# Patient Record
Sex: Male | Born: 1970 | Hispanic: Yes | Smoking: Current every day smoker
Health system: Southern US, Community
[De-identification: ages and names within clinical notes are randomized; demographics above are authoritative.]

## PROBLEM LIST (undated history)

## (undated) DIAGNOSIS — M1990 Unspecified osteoarthritis, unspecified site: Secondary | ICD-10-CM

## (undated) DIAGNOSIS — K603 Anal fistula, unspecified: Secondary | ICD-10-CM

## (undated) DIAGNOSIS — F8081 Childhood onset fluency disorder: Secondary | ICD-10-CM

## (undated) DIAGNOSIS — F419 Anxiety disorder, unspecified: Secondary | ICD-10-CM

## (undated) DIAGNOSIS — F17200 Nicotine dependence, unspecified, uncomplicated: Secondary | ICD-10-CM

## (undated) DIAGNOSIS — K219 Gastro-esophageal reflux disease without esophagitis: Secondary | ICD-10-CM

## (undated) DIAGNOSIS — F32A Depression, unspecified: Secondary | ICD-10-CM

## (undated) HISTORY — DX: Depression, unspecified: F32.A

## (undated) HISTORY — PX: KNEE ARTHROSCOPY: SUR90

## (undated) HISTORY — DX: Gastro-esophageal reflux disease without esophagitis: K21.9

---

## 2014-11-25 ENCOUNTER — Inpatient Hospital Stay (HOSPITAL_COMMUNITY)
Admission: EM | Admit: 2014-11-25 | Discharge: 2014-11-28 | DRG: 872 | Disposition: A | Discharge status: Home / Self Care | Payer: Self-pay | Attending: Internal Medicine | Admitting: Internal Medicine

## 2014-11-25 ENCOUNTER — Encounter (HOSPITAL_COMMUNITY): Payer: Self-pay | Admitting: Emergency Medicine

## 2014-11-25 DIAGNOSIS — L0390 Cellulitis, unspecified: Secondary | ICD-10-CM | POA: Diagnosis present

## 2014-11-25 DIAGNOSIS — R911 Solitary pulmonary nodule: Secondary | ICD-10-CM | POA: Diagnosis present

## 2014-11-25 DIAGNOSIS — F17210 Nicotine dependence, cigarettes, uncomplicated: Secondary | ICD-10-CM | POA: Diagnosis present

## 2014-11-25 DIAGNOSIS — A419 Sepsis, unspecified organism: Principal | ICD-10-CM | POA: Diagnosis present

## 2014-11-25 DIAGNOSIS — F1020 Alcohol dependence, uncomplicated: Secondary | ICD-10-CM | POA: Diagnosis present

## 2014-11-25 DIAGNOSIS — Z791 Long term (current) use of non-steroidal anti-inflammatories (NSAID): Secondary | ICD-10-CM

## 2014-11-25 DIAGNOSIS — K611 Rectal abscess: Secondary | ICD-10-CM | POA: Diagnosis present

## 2014-11-25 DIAGNOSIS — R0789 Other chest pain: Secondary | ICD-10-CM

## 2014-11-25 DIAGNOSIS — R7309 Other abnormal glucose: Secondary | ICD-10-CM | POA: Diagnosis present

## 2014-11-25 DIAGNOSIS — Z6841 Body Mass Index (BMI) 40.0 and over, adult: Secondary | ICD-10-CM

## 2014-11-25 DIAGNOSIS — Z01810 Encounter for preprocedural cardiovascular examination: Secondary | ICD-10-CM

## 2014-11-25 DIAGNOSIS — F1010 Alcohol abuse, uncomplicated: Secondary | ICD-10-CM

## 2014-11-25 HISTORY — DX: Unspecified osteoarthritis, unspecified site: M19.90

## 2014-11-25 LAB — TYPE AND SCREEN
ABO/RH(D): A POS
Antibody Screen: NEGATIVE

## 2014-11-25 LAB — COMPREHENSIVE METABOLIC PANEL
ALK PHOS: 72 U/L (ref 38–126)
ALT: 73 U/L — ABNORMAL HIGH (ref 17–63)
ANION GAP: 9 (ref 5–15)
AST: 44 U/L — AB (ref 15–41)
Albumin: 3.6 g/dL (ref 3.5–5.0)
BILIRUBIN TOTAL: 0.6 mg/dL (ref 0.3–1.2)
BUN: 12 mg/dL (ref 6–20)
CHLORIDE: 100 mmol/L — AB (ref 101–111)
CO2: 26 mmol/L (ref 22–32)
CREATININE: 0.93 mg/dL (ref 0.61–1.24)
Calcium: 8.7 mg/dL — ABNORMAL LOW (ref 8.9–10.3)
GFR calc Af Amer: >60 mL/min (ref >60)
GFR calc non Af Amer: >60 mL/min (ref >60)
Glucose, Bld: 128 mg/dL — ABNORMAL HIGH (ref 65–99)
Potassium: 4.2 mmol/L (ref 3.5–5.1)
Sodium: 135 mmol/L (ref 135–145)
Total Protein: 6.7 g/dL (ref 6.5–8.1)

## 2014-11-25 LAB — DIFFERENTIAL
BASOS PCT: 0 % (ref 0–1)
Basophils Absolute: 0 10*3/uL (ref 0.0–0.1)
EOS ABS: 0.1 10*3/uL (ref 0.0–0.7)
EOS PCT: 1 % (ref 0–5)
LYMPHS ABS: 1.6 10*3/uL (ref 0.7–4.0)
Lymphocytes Relative: 15 % (ref 12–46)
MONOS PCT: 5 % (ref 3–12)
Monocytes Absolute: 0.5 10*3/uL (ref 0.1–1.0)
NEUTROS PCT: 79 % — AB (ref 43–77)
Neutro Abs: 8.3 10*3/uL — ABNORMAL HIGH (ref 1.7–7.7)

## 2014-11-25 LAB — URINALYSIS, ROUTINE W REFLEX MICROSCOPIC
Bilirubin Urine: NEGATIVE
Glucose, UA: NEGATIVE mg/dL
Hgb urine dipstick: NEGATIVE
KETONES UR: NEGATIVE mg/dL
LEUKOCYTES UA: NEGATIVE
Nitrite: NEGATIVE
PH: 5 (ref 5.0–8.0)
Protein, ur: NEGATIVE mg/dL
SPECIFIC GRAVITY, URINE: 1.018 (ref 1.005–1.030)
Urobilinogen, UA: 0.2 mg/dL (ref 0.0–1.0)

## 2014-11-25 LAB — CBC
HCT: 39.8 % (ref 39.0–52.0)
Hemoglobin: 13.7 g/dL (ref 13.0–17.0)
MCH: 32.8 pg (ref 26.0–34.0)
MCHC: 34.4 g/dL (ref 30.0–36.0)
MCV: 95.2 fL (ref 78.0–100.0)
PLATELETS: 172 10*3/uL (ref 150–400)
RBC: 4.18 MIL/uL — ABNORMAL LOW (ref 4.22–5.81)
RDW: 12.8 % (ref 11.5–15.5)
WBC: 11.1 10*3/uL — AB (ref 4.0–10.5)

## 2014-11-25 LAB — I-STAT CG4 LACTIC ACID, ED: LACTIC ACID, VENOUS: 2.19 mmol/L — AB (ref 0.5–2.0)

## 2014-11-25 MED ORDER — MORPHINE SULFATE 4 MG/ML IJ SOLN
4.0000 mg | Freq: Once | INTRAMUSCULAR | Status: AC
  Administered 2014-11-25: 4 mg via INTRAVENOUS
  Filled 2014-11-25: qty 1

## 2014-11-25 MED ORDER — ONDANSETRON HCL 4 MG/2ML IJ SOLN
4.0000 mg | Freq: Once | INTRAMUSCULAR | Status: AC
Start: 2014-11-25 — End: 2014-11-25
  Administered 2014-11-25: 4 mg via INTRAVENOUS
  Filled 2014-11-25: qty 2

## 2014-11-25 NOTE — ED Notes (Signed)
Pt. reports left lower buttocks abscess with drainage onset 3 weeks ago .

## 2014-11-25 NOTE — ED Notes (Signed)
Here for abcess on sacral area.  States I have also had bloody stools for about three weeks.  States it also looks like it has puss in the stool with a foul odor.  Abdomen bloated as well.  Also reports being disoriented with cloudy thinking earlier today.

## 2014-11-26 ENCOUNTER — Encounter (HOSPITAL_COMMUNITY): Payer: Self-pay

## 2014-11-26 ENCOUNTER — Observation Stay (HOSPITAL_COMMUNITY): Payer: Self-pay

## 2014-11-26 ENCOUNTER — Inpatient Hospital Stay (HOSPITAL_COMMUNITY): Payer: Self-pay | Admitting: Anesthesiology

## 2014-11-26 ENCOUNTER — Encounter (HOSPITAL_COMMUNITY)
Admission: EM | Disposition: A | Discharge status: Home / Self Care | Payer: Self-pay | Source: Home / Self Care | Attending: Internal Medicine

## 2014-11-26 ENCOUNTER — Emergency Department (HOSPITAL_COMMUNITY): Payer: Self-pay

## 2014-11-26 DIAGNOSIS — K611 Rectal abscess: Secondary | ICD-10-CM | POA: Diagnosis present

## 2014-11-26 DIAGNOSIS — A419 Sepsis, unspecified organism: Principal | ICD-10-CM | POA: Diagnosis present

## 2014-11-26 DIAGNOSIS — R0789 Other chest pain: Secondary | ICD-10-CM

## 2014-11-26 DIAGNOSIS — F1010 Alcohol abuse, uncomplicated: Secondary | ICD-10-CM

## 2014-11-26 HISTORY — PX: IRRIGATION AND DEBRIDEMENT BUTTOCKS: SHX6601

## 2014-11-26 HISTORY — PX: INCISION AND DRAINAGE ABSCESS: SHX5864

## 2014-11-26 HISTORY — DX: Sepsis, unspecified organism: A41.9

## 2014-11-26 LAB — CBC WITH DIFFERENTIAL/PLATELET
Basophils Absolute: 0 10*3/uL (ref 0.0–0.1)
Basophils Relative: 0 % (ref 0–1)
Eosinophils Absolute: 0.1 10*3/uL (ref 0.0–0.7)
Eosinophils Relative: 1 % (ref 0–5)
HCT: 39.5 % (ref 39.0–52.0)
Hemoglobin: 13.4 g/dL (ref 13.0–17.0)
LYMPHS ABS: 1.8 10*3/uL (ref 0.7–4.0)
LYMPHS PCT: 19 % (ref 12–46)
MCH: 31.9 pg (ref 26.0–34.0)
MCHC: 33.9 g/dL (ref 30.0–36.0)
MCV: 94 fL (ref 78.0–100.0)
MONO ABS: 0.6 10*3/uL (ref 0.1–1.0)
Monocytes Relative: 6 % (ref 3–12)
Neutro Abs: 7 10*3/uL (ref 1.7–7.7)
Neutrophils Relative %: 74 % (ref 43–77)
PLATELETS: 160 10*3/uL (ref 150–400)
RBC: 4.2 MIL/uL — AB (ref 4.22–5.81)
RDW: 12.7 % (ref 11.5–15.5)
WBC: 9.5 10*3/uL (ref 4.0–10.5)

## 2014-11-26 LAB — I-STAT TROPONIN, ED: Troponin i, poc: 0 ng/mL (ref 0.00–0.08)

## 2014-11-26 LAB — SURGICAL PCR SCREEN
MRSA, PCR: NEGATIVE
Staphylococcus aureus: NEGATIVE

## 2014-11-26 LAB — PROCALCITONIN: Procalcitonin: <0.1 ng/mL

## 2014-11-26 LAB — PROTIME-INR
INR: 1.03 (ref 0.00–1.49)
Prothrombin Time: 13.7 seconds (ref 11.6–15.2)

## 2014-11-26 LAB — COMPREHENSIVE METABOLIC PANEL
ALBUMIN: 3.4 g/dL — AB (ref 3.5–5.0)
ALT: 66 U/L — ABNORMAL HIGH (ref 17–63)
AST: 34 U/L (ref 15–41)
Alkaline Phosphatase: 69 U/L (ref 38–126)
Anion gap: 8 (ref 5–15)
BUN: 10 mg/dL (ref 6–20)
CALCIUM: 8.3 mg/dL — AB (ref 8.9–10.3)
CHLORIDE: 100 mmol/L — AB (ref 101–111)
CO2: 26 mmol/L (ref 22–32)
CREATININE: 0.91 mg/dL (ref 0.61–1.24)
GFR calc Af Amer: >60 mL/min (ref >60)
GFR calc non Af Amer: >60 mL/min (ref >60)
Glucose, Bld: 91 mg/dL (ref 65–99)
Potassium: 4 mmol/L (ref 3.5–5.1)
Sodium: 134 mmol/L — ABNORMAL LOW (ref 135–145)
Total Bilirubin: 0.7 mg/dL (ref 0.3–1.2)
Total Protein: 6.6 g/dL (ref 6.5–8.1)

## 2014-11-26 LAB — TROPONIN I
Troponin I: <0.03 ng/mL (ref <0.031)
Troponin I: <0.03 ng/mL (ref <0.031)
Troponin I: <0.03 ng/mL (ref <0.031)

## 2014-11-26 LAB — APTT: aPTT: 30 seconds (ref 24–37)

## 2014-11-26 LAB — GLUCOSE, CAPILLARY: Glucose-Capillary: 105 mg/dL — ABNORMAL HIGH (ref 65–99)

## 2014-11-26 LAB — ABO/RH: ABO/RH(D): A POS

## 2014-11-26 LAB — LACTIC ACID, PLASMA: Lactic Acid, Venous: 1.4 mmol/L (ref 0.5–2.0)

## 2014-11-26 SURGERY — IRRIGATION AND DEBRIDEMENT BUTTOCKS
Anesthesia: General | Site: Buttocks | Laterality: Left

## 2014-11-26 MED ORDER — SUCCINYLCHOLINE CHLORIDE 20 MG/ML IJ SOLN
INTRAMUSCULAR | Status: DC | PRN
  Administered 2014-11-26: 140 mg via INTRAVENOUS

## 2014-11-26 MED ORDER — HYDROMORPHONE HCL 1 MG/ML IJ SOLN
1.0000 mg | INTRAMUSCULAR | Status: DC | PRN
  Administered 2014-11-26 – 2014-11-27 (×6): 1 mg via INTRAVENOUS
  Filled 2014-11-26 (×7): qty 1

## 2014-11-26 MED ORDER — OXYCODONE-ACETAMINOPHEN 5-325 MG PO TABS
1.0000 | ORAL_TABLET | ORAL | Status: DC | PRN
  Administered 2014-11-26 – 2014-11-27 (×3): 1 via ORAL
  Filled 2014-11-26 (×3): qty 1

## 2014-11-26 MED ORDER — IOHEXOL 300 MG/ML  SOLN
100.0000 mL | Freq: Once | INTRAMUSCULAR | Status: AC | PRN
  Administered 2014-11-26: 100 mL via INTRAVENOUS

## 2014-11-26 MED ORDER — PROPOFOL 10 MG/ML IV BOLUS
INTRAVENOUS | Status: DC | PRN
  Administered 2014-11-26: 200 mg via INTRAVENOUS

## 2014-11-26 MED ORDER — LORAZEPAM 2 MG/ML IJ SOLN: 1.0000 mg | Freq: Four times a day (QID) | INTRAMUSCULAR | Status: DC | PRN

## 2014-11-26 MED ORDER — VANCOMYCIN HCL IN DEXTROSE 1-5 GM/200ML-% IV SOLN
1000.0000 mg | Freq: Once | INTRAVENOUS | Status: AC
  Administered 2014-11-26: 1000 mg via INTRAVENOUS
  Filled 2014-11-26: qty 200

## 2014-11-26 MED ORDER — SODIUM CHLORIDE 0.9 % IV SOLN
1000.0000 mL | Freq: Once | INTRAVENOUS | Status: AC
  Administered 2014-11-26: 1000 mL via INTRAVENOUS

## 2014-11-26 MED ORDER — LORAZEPAM 1 MG PO TABS: 1.0000 mg | ORAL_TABLET | Freq: Four times a day (QID) | ORAL | Status: DC | PRN

## 2014-11-26 MED ORDER — PIPERACILLIN-TAZOBACTAM 3.375 G IVPB
3.3750 g | Freq: Three times a day (TID) | INTRAVENOUS | Status: DC
  Administered 2014-11-26 – 2014-11-28 (×7): 3.375 g via INTRAVENOUS
  Filled 2014-11-26 (×10): qty 50

## 2014-11-26 MED ORDER — GLYCOPYRROLATE 0.2 MG/ML IJ SOLN
INTRAMUSCULAR | Status: AC
  Filled 2014-11-26: qty 3

## 2014-11-26 MED ORDER — FENTANYL CITRATE (PF) 100 MCG/2ML IJ SOLN
INTRAMUSCULAR | Status: DC | PRN
  Administered 2014-11-26: 100 ug via INTRAVENOUS
  Administered 2014-11-26: 50 ug via INTRAVENOUS
  Administered 2014-11-26: 100 ug via INTRAVENOUS

## 2014-11-26 MED ORDER — ONDANSETRON HCL 4 MG/2ML IJ SOLN
INTRAMUSCULAR | Status: DC | PRN
  Administered 2014-11-26: 4 mg via INTRAVENOUS

## 2014-11-26 MED ORDER — 0.9 % SODIUM CHLORIDE (POUR BTL) OPTIME
TOPICAL | Status: DC | PRN
  Administered 2014-11-26: 1000 mL

## 2014-11-26 MED ORDER — LACTATED RINGERS IV SOLN
INTRAVENOUS | Status: DC | PRN
  Administered 2014-11-26: 09:00:00 via INTRAVENOUS

## 2014-11-26 MED ORDER — ACETAMINOPHEN 650 MG RE SUPP
650.0000 mg | Freq: Four times a day (QID) | RECTAL | Status: DC | PRN
Start: 2014-11-26 — End: 2014-11-28

## 2014-11-26 MED ORDER — FENTANYL CITRATE (PF) 250 MCG/5ML IJ SOLN
INTRAMUSCULAR | Status: AC
  Filled 2014-11-26: qty 5

## 2014-11-26 MED ORDER — PNEUMOCOCCAL VAC POLYVALENT 25 MCG/0.5ML IJ INJ
0.5000 mL | INJECTION | INTRAMUSCULAR | Status: AC
  Administered 2014-11-27: 0.5 mL via INTRAMUSCULAR
  Filled 2014-11-26: qty 0.5

## 2014-11-26 MED ORDER — LACTATED RINGERS IV SOLN
INTRAVENOUS | Status: DC
  Administered 2014-11-26: 09:00:00 via INTRAVENOUS

## 2014-11-26 MED ORDER — MIDAZOLAM HCL 2 MG/2ML IJ SOLN
INTRAMUSCULAR | Status: AC
  Filled 2014-11-26: qty 2

## 2014-11-26 MED ORDER — ONDANSETRON HCL 4 MG PO TABS: 4.0000 mg | ORAL_TABLET | Freq: Four times a day (QID) | ORAL | Status: DC | PRN

## 2014-11-26 MED ORDER — ONDANSETRON HCL 4 MG/2ML IJ SOLN: 4.0000 mg | Freq: Four times a day (QID) | INTRAMUSCULAR | Status: DC | PRN

## 2014-11-26 MED ORDER — PIPERACILLIN-TAZOBACTAM 3.375 G IVPB
3.3750 g | Freq: Once | INTRAVENOUS | Status: AC
  Administered 2014-11-26: 3.375 g via INTRAVENOUS
  Filled 2014-11-26: qty 50

## 2014-11-26 MED ORDER — SODIUM CHLORIDE 0.9 % IV SOLN
1000.0000 mL | INTRAVENOUS | Status: DC
  Administered 2014-11-27: 1000 mL via INTRAVENOUS

## 2014-11-26 MED ORDER — LORAZEPAM 2 MG/ML IJ SOLN: 0.0000 mg | Freq: Four times a day (QID) | INTRAMUSCULAR | Status: AC

## 2014-11-26 MED ORDER — SODIUM CHLORIDE 0.9 % IV SOLN: 1000.0000 mL | INTRAVENOUS | Status: DC

## 2014-11-26 MED ORDER — LIDOCAINE HCL 4 % MT SOLN
OROMUCOSAL | Status: DC | PRN
  Administered 2014-11-26: 4 mL via TOPICAL

## 2014-11-26 MED ORDER — SODIUM CHLORIDE 0.9 % IV SOLN
INTRAVENOUS | Status: DC
  Administered 2014-11-26: 03:00:00 via INTRAVENOUS

## 2014-11-26 MED ORDER — FOLIC ACID 1 MG PO TABS
1.0000 mg | ORAL_TABLET | Freq: Every day | ORAL | Status: DC
  Administered 2014-11-27 – 2014-11-28 (×2): 1 mg via ORAL
  Filled 2014-11-26 (×4): qty 1

## 2014-11-26 MED ORDER — SODIUM CHLORIDE 0.9 % IV SOLN
INTRAVENOUS | Status: DC
  Administered 2014-11-26 (×2): via INTRAVENOUS

## 2014-11-26 MED ORDER — PROPOFOL 10 MG/ML IV BOLUS: INTRAVENOUS | Status: DC | PRN

## 2014-11-26 MED ORDER — LORAZEPAM 2 MG/ML IJ SOLN: 0.0000 mg | Freq: Two times a day (BID) | INTRAMUSCULAR | Status: DC

## 2014-11-26 MED ORDER — HYDROMORPHONE HCL 1 MG/ML IJ SOLN: 0.2500 mg | INTRAMUSCULAR | Status: DC | PRN

## 2014-11-26 MED ORDER — LIDOCAINE HCL (CARDIAC) 20 MG/ML IV SOLN
INTRAVENOUS | Status: DC | PRN
  Administered 2014-11-26: 50 mg via INTRAVENOUS

## 2014-11-26 MED ORDER — ADULT MULTIVITAMIN W/MINERALS CH
1.0000 | ORAL_TABLET | Freq: Every day | ORAL | Status: DC
  Administered 2014-11-27 – 2014-11-28 (×2): 1 via ORAL
  Filled 2014-11-26 (×4): qty 1

## 2014-11-26 MED ORDER — MIDAZOLAM HCL 5 MG/5ML IJ SOLN
INTRAMUSCULAR | Status: DC | PRN
  Administered 2014-11-26: 2 mg via INTRAVENOUS

## 2014-11-26 MED ORDER — HYDROMORPHONE HCL 1 MG/ML IJ SOLN
1.0000 mg | INTRAMUSCULAR | Status: DC | PRN
  Administered 2014-11-26: 1 mg via INTRAVENOUS
  Filled 2014-11-26: qty 1

## 2014-11-26 MED ORDER — PIPERACILLIN-TAZOBACTAM 3.375 G IVPB 30 MIN: 3.3750 g | Freq: Once | INTRAVENOUS | Status: DC

## 2014-11-26 MED ORDER — NEOSTIGMINE METHYLSULFATE 10 MG/10ML IV SOLN
INTRAVENOUS | Status: AC
  Filled 2014-11-26: qty 1

## 2014-11-26 MED ORDER — VANCOMYCIN HCL 10 G IV SOLR
1250.0000 mg | Freq: Three times a day (TID) | INTRAVENOUS | Status: DC
  Administered 2014-11-26 – 2014-11-28 (×7): 1250 mg via INTRAVENOUS
  Filled 2014-11-26 (×10): qty 1250

## 2014-11-26 MED ORDER — ACETAMINOPHEN 325 MG PO TABS: 650.0000 mg | ORAL_TABLET | Freq: Four times a day (QID) | ORAL | Status: DC | PRN

## 2014-11-26 MED ORDER — VITAMIN B-1 100 MG PO TABS
100.0000 mg | ORAL_TABLET | Freq: Every day | ORAL | Status: DC
  Administered 2014-11-27 – 2014-11-28 (×2): 100 mg via ORAL
  Filled 2014-11-26 (×4): qty 1

## 2014-11-26 MED ORDER — THIAMINE HCL 100 MG/ML IJ SOLN
100.0000 mg | Freq: Every day | INTRAMUSCULAR | Status: DC
  Filled 2014-11-26 (×3): qty 1

## 2014-11-26 SURGICAL SUPPLY — 52 items
BENZOIN TINCTURE PRP APPL 2/3 (GAUZE/BANDAGES/DRESSINGS) IMPLANT
BLADE SURG 15 STRL LF DISP TIS (BLADE) ×1 IMPLANT
BLADE SURG 15 STRL SS (BLADE) ×2
BLADE SURG ROTATE 9660 (MISCELLANEOUS) IMPLANT
CANISTER SUCTION 2500CC (MISCELLANEOUS) ×3 IMPLANT
CLOSURE WOUND 1/2 X4 (GAUZE/BANDAGES/DRESSINGS)
COVER SURGICAL LIGHT HANDLE (MISCELLANEOUS) ×3 IMPLANT
DRAIN PENROSE 1/2X12 LTX STRL (WOUND CARE) ×3 IMPLANT
DRAPE LAPAROSCOPIC ABDOMINAL (DRAPES) ×3 IMPLANT
DRAPE LAPAROTOMY T 102X78X121 (DRAPES) IMPLANT
DRAPE UTILITY XL STRL (DRAPES) IMPLANT
ELECT CAUTERY BLADE 6.4 (BLADE) ×3 IMPLANT
ELECT REM PT RETURN 9FT ADLT (ELECTROSURGICAL) ×3
ELECTRODE REM PT RTRN 9FT ADLT (ELECTROSURGICAL) ×1 IMPLANT
GAUZE SPONGE 4X4 12PLY STRL (GAUZE/BANDAGES/DRESSINGS) ×3 IMPLANT
GLOVE BIO SURGEON STRL SZ7 (GLOVE) ×3 IMPLANT
GLOVE BIOGEL PI IND STRL 6.5 (GLOVE) ×1 IMPLANT
GLOVE BIOGEL PI IND STRL 7.0 (GLOVE) ×1 IMPLANT
GLOVE BIOGEL PI IND STRL 7.5 (GLOVE) ×2 IMPLANT
GLOVE BIOGEL PI IND STRL 8 (GLOVE) ×1 IMPLANT
GLOVE BIOGEL PI INDICATOR 6.5 (GLOVE) ×2
GLOVE BIOGEL PI INDICATOR 7.0 (GLOVE) ×2
GLOVE BIOGEL PI INDICATOR 7.5 (GLOVE) ×4
GLOVE BIOGEL PI INDICATOR 8 (GLOVE) ×2
GLOVE SURG SS PI 7.0 STRL IVOR (GLOVE) ×3 IMPLANT
GOWN STRL REUS W/ TWL LRG LVL3 (GOWN DISPOSABLE) ×3 IMPLANT
GOWN STRL REUS W/TWL LRG LVL3 (GOWN DISPOSABLE) ×6
KIT BASIN OR (CUSTOM PROCEDURE TRAY) ×3 IMPLANT
KIT ROOM TURNOVER OR (KITS) ×3 IMPLANT
NEEDLE HYPO 25GX1X1/2 BEV (NEEDLE) IMPLANT
NS IRRIG 1000ML POUR BTL (IV SOLUTION) ×3 IMPLANT
PACK SURGICAL SETUP 50X90 (CUSTOM PROCEDURE TRAY) ×3 IMPLANT
PAD ARMBOARD 7.5X6 YLW CONV (MISCELLANEOUS) ×9 IMPLANT
PENCIL BUTTON HOLSTER BLD 10FT (ELECTRODE) ×3 IMPLANT
SPECIMEN JAR SMALL (MISCELLANEOUS) IMPLANT
SPONGE LAP 18X18 X RAY DECT (DISPOSABLE) IMPLANT
SPONGE LAP 4X18 X RAY DECT (DISPOSABLE) IMPLANT
STRIP CLOSURE SKIN 1/2X4 (GAUZE/BANDAGES/DRESSINGS) IMPLANT
SURGILUBE 2OZ TUBE FLIPTOP (MISCELLANEOUS) ×3 IMPLANT
SUT ETHILON 2 0 FS 18 (SUTURE) ×3 IMPLANT
SUT PROLENE 3 0 PS 1 (SUTURE) IMPLANT
SUT VIC AB 3-0 SH 18 (SUTURE) IMPLANT
SWAB COLLECTION DEVICE MRSA (MISCELLANEOUS) ×3 IMPLANT
SYR BULB IRRIGATION 50ML (SYRINGE) ×3 IMPLANT
SYR CONTROL 10ML LL (SYRINGE) IMPLANT
TOWEL OR 17X24 6PK STRL BLUE (TOWEL DISPOSABLE) ×6 IMPLANT
TOWEL OR 17X26 10 PK STRL BLUE (TOWEL DISPOSABLE) IMPLANT
TUBE ANAEROBIC SPECIMEN COL (MISCELLANEOUS) ×3 IMPLANT
TUBE CONNECTING 12'X1/4 (SUCTIONS) ×1
TUBE CONNECTING 12X1/4 (SUCTIONS) ×2 IMPLANT
WATER STERILE IRR 1000ML POUR (IV SOLUTION) IMPLANT
YANKAUER SUCT BULB TIP NO VENT (SUCTIONS) ×3 IMPLANT

## 2014-11-26 NOTE — Anesthesia Procedure Notes (Signed)
Procedure Name: Intubation Date/Time: 11/26/2014 9:20 AM Performed by: Tamala Fothergill S Intubation Type: IV induction Laryngoscope Size: Miller and 3 Grade View: Grade II Tube type: Oral Tube size: 7.5 mm Number of attempts: 1 Placement Confirmation: ETT inserted through vocal cords under direct vision,  breath sounds checked- equal and bilateral and positive ETCO2 Secured at: 23 cm Tube secured with: Tape Dental Injury: Teeth and Oropharynx as per pre-operative assessment

## 2014-11-26 NOTE — H&P (Signed)
[facility] History and Physical  Jon Wade VEL:[id number] DOB: 1970/10/23 DOA: 11/25/2014  Referring physician: Dr.Yelverton. PCP: No PCP Per Patient  Specialists: None.  Chief Complaint: Rectal bleeding and pain.  HPI: Jon Wade is a 44 y.o. male with history of alcohol abuse presents to ER because of rectal pain and bleeding. Patient states that over the last couple of months patient has been having rectal pain but over the last 3 days pain worsened with fever chills and worsening rectal bleeding. In the ER patient had CT abdomen and pelvis which shows features consistent with perirectal abscess and on exam patient has induration around the rectal area. On-call surgeon Dr. Redmond Pulling has been consulted and patient has been admitted for further management. Patient also stated some crampy type of chest pain mostly in the epigastric area over the last few days. EKG and chest x-ray are unremarkable and [name] patient is chest pain-free. Patient also had 2-3 episodes of nausea vomiting and abdomen appears benign at this time.   Review of Systems: As presented in the history of presenting illness, rest negative.  History reviewed. No pertinent past medical history. Past Surgical History  Procedure Laterality Date  . Knee arthroscopy     Social History:  reports that he has been smoking.  He does not have any smokeless tobacco history on file. He reports that he drinks alcohol. He reports that he uses illicit drugs (Marijuana). Where does patient live home. Can patient participate in ADLs? Yes.  No Known Allergies  Family History:  Family History  Problem Relation Age of Onset  . Diabetes Mellitus II Father       Prior to Admission medications   Medication Sig Start Date End Date Taking? Authorizing Provider  ibuprofen (ADVIL,MOTRIN) 200 MG tablet Take 400-800 mg by mouth every 6 (six) hours as needed for mild pain.   Yes Historical Provider, MD    Physical Exam: Filed  Vitals:   11/26/14 0130 11/26/14 0157 11/26/14 0221 11/26/14 0230  BP: 131/80  142/86   Pulse: 83  81 80  Temp:  99.9 F (37.7 C) 99.6 F (37.6 C)   TempSrc:  Oral Oral   Resp: 20  18   Height:   5' 10.8" (1.798 m)   Weight:   130.228 kg (287 lb 1.6 oz)   SpO2: 94%  100%      General:  Moderately built and nourished.  Eyes: Anicteric no pallor.  ENT: No discharge from the ears eyes nose and mouth.  Neck: No mass felt.  Cardiovascular: S1 and S2 heard.  Respiratory: No rhonchi or crepitations.  Abdomen: Soft nontender bowel sounds present.  Skin: Induration around the rectal area.  Musculoskeletal: No edema.  Psychiatric: Appears normal.  Neurologic: Alert and oriented to time place and person. Moves all extremities.  Labs on Admission:  Basic Metabolic Panel:  Recent Labs Lab 11/25/14 2155  NA 135  K 4.2  CL 100*  CO2 26  GLUCOSE 128*  BUN 12  CREATININE 0.93  CALCIUM 8.7*   Liver Function Tests:  Recent Labs Lab 11/25/14 2155  AST 44*  ALT 73*  ALKPHOS 72  BILITOT 0.6  PROT 6.7  ALBUMIN 3.6   No results for input(s): LIPASE, AMYLASE in the last 168 hours. No results for input(s): AMMONIA in the last 168 hours. CBC:  Recent Labs Lab 11/25/14 2155  WBC 11.1*  NEUTROABS 8.3*  HGB 13.7  HCT 39.8  MCV 95.2  PLT 172   Cardiac  Enzymes: No results for input(s): CKTOTAL, CKMB, CKMBINDEX, TROPONINI in the last 168 hours.  BNP (last 3 results) No results for input(s): BNP in the last 8760 hours.  ProBNP (last 3 results) No results for input(s): PROBNP in the last 8760 hours.  CBG: No results for input(s): GLUCAP in the last 168 hours.  Radiological Exams on Admission: Dg Chest 2 View  11/26/2014   CLINICAL DATA:  Mid chest pain tonight, blood in stool.  EXAM: CHEST  2 VIEW  COMPARISON:  Included lung bases from CT abdomen earlier this day.  FINDINGS: The cardiomediastinal contours are normal. The lungs are clear. Small pulmonary nodule  on prior CT is not seen radiographically. Pulmonary vasculature is normal. No consolidation, pleural effusion, or pneumothorax. No acute osseous abnormalities are seen. Mild degenerative change in the spine.  IMPRESSION: No acute pulmonary process.   Electronically Signed   By: Jeb Levering M.D.   On: 11/26/2014 02:56   Ct Abdomen Pelvis W Contrast  11/26/2014   CLINICAL DATA:  Initial evaluation for acute rectal bleeding, concern for perirectal abscess.  EXAM: CT ABDOMEN AND PELVIS WITH CONTRAST  TECHNIQUE: Multidetector CT imaging of the abdomen and pelvis was performed using the standard protocol following bolus administration of intravenous contrast.  CONTRAST:  160mL OMNIPAQUE IOHEXOL 300 MG/ML  SOLN  COMPARISON:  None.  FINDINGS: AE 5 mm nodule present within the left lower lobe (series 205, image 5). The visualized lung bases are otherwise clear.  Diffuse hypoattenuation of the liver is consistent with steatosis. Focal fatty sparing seen within the subcapsular left hepatic lobe. Liver otherwise unremarkable. Gallbladder within normal limits. No biliary dilatation. The spleen, adrenal glands, and pancreas demonstrate a normal contrast enhanced appearance.  Kidneys are equal in size with symmetric enhancement. No nephrolithiasis, hydronephrosis, or focal enhancing renal mass.  The stomach is within normal limits. No evidence for bowel obstruction. Appendix is normal. No acute inflammatory changes seen about the bowels.  There is a in loculated hypodense collection measuring 8.6 x 2.4 x 4.2 cm within the left paramedian subcu gluteal fat (series 201, image 103). This closely approximates the left lateral aspect of the rectum near the anal verge. There is overlying inflammatory stranding with skin thickening, suggestive of cellulitis. No dissecting soft tissue emphysema identified.  Bladder within normal limits.  Prostate normal.  No free air or fluid. Small fat containing bilateral inguinal hernias noted.  No pathologically enlarged intra-abdominal or pelvic lymph nodes identified. Normal intravascular enhancement seen throughout the intra-abdominal aorta. No aneurysm.  No acute osseous abnormality. No worrisome lytic or blastic osseous lesion.  IMPRESSION: 1. 8.6 x 2.4 x 4.2 cm abscess within the left paramedian subcutaneous gluteal fat, closely approximating the rectum near the level of the anal verge. Overlying inflammatory changes suggestive of associated cellulitis. 2. No other acute intra-abdominal or pelvic process identified. 3. Hepatic steatosis. 4. 5 mm left lower lobe pulmonary nodule, indeterminate. If the patient is at high risk for bronchogenic carcinoma, follow-up chest CT at 6-12 months is recommended. If the patient is at low risk for bronchogenic carcinoma, follow-up chest CT at 12 months is recommended. This recommendation follows the consensus statement: Guidelines for Management of Small Pulmonary Nodules Detected on CT Scans: A Statement from the [employer] as published in Radiology 2005;237:395-400.   Electronically Signed   By: Jeannine Boga M.D.   On: 11/26/2014 01:16    EKG: Independently reviewed. Normal sinus rhythm.  Assessment/Plan Principal Problem:   Sepsis Active Problems:  Perirectal abscess   Alcohol abuse   Atypical chest pain   1. Sepsis from perirectal abscess - appreciate surgery consult. I have reviewed Dr. Dois Davenport notes and recommendations. Patient will be kept nothing by mouth and on IV antibiotics. Patient likely to have procedure in morning for the rectal abscess. 2. History of alcohol abuse - patient is placed on alcohol withdrawal protocol. 3. Atypical chest pain - patient has pain mostly cramping in nature. Cycle cardiac markers. Currently chest pain-free. 4. Mildly elevated LFTs most likely secondary to alcoholism - follow LFTs. Further workup as outpatient.   DVT Prophylaxis SCDs.  Code Status: Full code.  Family Communication:  Discussed with patient.  Disposition Plan: Admit to inpatient.    Yariela Tison N. [facility] Pager [phone].  If 7PM-7AM, please contact night-coverage [email or web address] Password [facility] 11/26/2014, 3:27 AM

## 2014-11-26 NOTE — ED Provider Notes (Signed)
CSN: [id number]     Arrival date & time 11/25/14  1859 History   First MD Initiated Contact with Patient 11/25/14 2107     Chief Complaint  Patient presents with  . Abscess     (Consider location/radiation/quality/duration/timing/severity/associated sxs/prior Treatment) Patient is a 44 y.o. male presenting with abscess. The history is provided by the patient. No language interpreter was used.  Abscess Location:  Ano-genital Ano-genital abscess location:  L buttock Associated symptoms: fever, nausea and vomiting   Associated symptoms comment:  The patient complains of a painful swelling to left buttock near the rectum, bleeding and "white discharge" per rectum, and fever. He reports on and off rectal bleeding for a long time (years) that has been more constant for 3 weeks. Symptoms of abscess started 3 weeks ago as well with nausea and vomiting. He reports developing a fever today. In addition to blood per rectum (normal stool color) he is passing what he describes as white mucous that is malodorous. No abdominal pain.   History reviewed. No pertinent past medical history. Past Surgical History  Procedure Laterality Date  . Knee arthroscopy     No family history on file. History  Substance Use Topics  . Smoking status: Current Every Day Smoker  . Smokeless tobacco: Not on file  . Alcohol Use: Yes    Review of Systems  Constitutional: Positive for fever and chills.  HENT: Negative.   Respiratory: Negative.  Negative for shortness of breath.   Cardiovascular: Negative.  Negative for chest pain.  Gastrointestinal: Positive for nausea, vomiting and blood in stool. Negative for abdominal pain.  Genitourinary: Negative.   Musculoskeletal: Negative.   Skin: Negative.        See HPI.  Neurological: Positive for weakness and light-headedness.      Allergies  Review of patient's allergies indicates no known allergies.  Home Medications   Prior to Admission medications    Medication Sig Start Date End Date Taking? Authorizing Provider  ibuprofen (ADVIL,MOTRIN) 200 MG tablet Take 400-800 mg by mouth every 6 (six) hours as needed for mild pain.   Yes Historical Provider, MD   BP 141/90 mmHg  Pulse 94  Temp(Src) 100.1 F (37.8 C) (Oral)  Resp 18  Ht 5\' 9"  (1.753 m)  Wt 290 lb (131.543 kg)  BMI 42.81 kg/m2  SpO2 100% Physical Exam  Constitutional: He is oriented to person, place, and time. He appears well-developed and well-nourished.  Neck: Normal range of motion.  Cardiovascular: Normal rate.   Pulmonary/Chest: Effort normal.  Abdominal: Soft. There is no tenderness. There is no rebound and no guarding.  Genitourinary:  Large indurated tender area left buttock at gluteal fold with tenderness extending to perirectal area. No area of fluctuance. There is no active bleeding from rectum.   Musculoskeletal: Normal range of motion.  Neurological: He is alert and oriented to person, place, and time. Coordination normal.  Skin: Skin is warm and dry. There is erythema.  Psychiatric: He has a normal mood and affect.    ED Course  Procedures (including critical care time) Labs Review Labs Reviewed  CBC - Abnormal; Notable for the following:    WBC 11.1 (*)    RBC 4.18 (*)    All other components within normal limits  COMPREHENSIVE METABOLIC PANEL - Abnormal; Notable for the following:    Chloride 100 (*)    Glucose, Bld 128 (*)    Calcium 8.7 (*)    AST 44 (*)    ALT  73 (*)    All other components within normal limits  DIFFERENTIAL - Abnormal; Notable for the following:    Neutrophils Relative % 79 (*)    Neutro Abs 8.3 (*)    All other components within normal limits  I-STAT CG4 LACTIC ACID, ED - Abnormal; Notable for the following:    Lactic Acid, Venous 2.19 (*)    All other components within normal limits  URINALYSIS, ROUTINE W REFLEX MICROSCOPIC (NOT AT [facility])  POC OCCULT BLOOD, ED  TYPE AND SCREEN  ABO/RH    Imaging Review No results  found.   EKG Interpretation None      MDM   Final diagnoses:  None    1. Perirectal abscess  CT pending  Discussed with Dr. Greer Pickerel who advised he will see and evaluate the patient. Zosyn started. The patient is being kept NPO until surgical evaluation.     Charlann Lange, PA-C 11/26/14 0018  Charlesetta Shanks, MD 11/26/14 [phone]

## 2014-11-26 NOTE — Transfer of Care (Signed)
Immediate Anesthesia Transfer of Care Note  Patient: Jon Wade  Procedure(s) Performed: Procedure(s): Incision and Drainage of Left Buttock Abscess (Left)  Patient Location: PACU  Anesthesia Type:General  Level of Consciousness: awake, alert  and oriented  Airway & Oxygen Therapy: Patient Spontanous Breathing and Patient connected to nasal cannula oxygen  Post-op Assessment: Report given to RN and Post -op Vital signs reviewed and stable  Post vital signs: Reviewed and stable  Last Vitals:  Filed Vitals:   11/26/14 1006  BP: 157/76  Pulse: 85  Temp: 36.8 C  Resp: 14    Complications: No apparent anesthesia complications

## 2014-11-26 NOTE — Progress Notes (Addendum)
[facility]   Admission HPI reviewed. Patient seen and examined.  Continue with IV fluids, IV antibiotics. Patient underwent incision and drainage of perirectal abscess today. Troponin negative. Counseling for Alcohol  provide. Need follow up Ct chest to follow lung nodule/ . Screening for HIV ordered. Continue with CIWA>   Jon Wade , Md.

## 2014-11-26 NOTE — Anesthesia Preprocedure Evaluation (Addendum)
Anesthesia Evaluation  Patient identified by MRN, date of birth, ID band Patient awake    Reviewed: Allergy & Precautions, H&P , NPO status , Patient's Chart, lab work & pertinent test results  Airway Mallampati: II  TM Distance: >3 FB Neck ROM: Full    Dental no notable dental hx. (+) Teeth Intact, Dental Advisory Given   Pulmonary Current Smoker,  breath sounds clear to auscultation  Pulmonary exam normal       Cardiovascular negative cardio ROS  Rhythm:Regular Rate:Normal     Neuro/Psych negative neurological ROS  negative psych ROS   GI/Hepatic negative GI ROS, Neg liver ROS,   Endo/Other  Morbid obesity  Renal/GU negative Renal ROS  negative genitourinary   Musculoskeletal   Abdominal   Peds  Hematology negative hematology ROS (+)   Anesthesia Other Findings   Reproductive/Obstetrics negative OB ROS                            Anesthesia Physical Anesthesia Plan  ASA: II  Anesthesia Plan: General   Post-op Pain Management:    Induction: Intravenous  Airway Management Planned: Oral ETT  Additional Equipment:   Intra-op Plan:   Post-operative Plan: Extubation in OR  Informed Consent: I have reviewed the patients History and Physical, chart, labs and discussed the procedure including the risks, benefits and alternatives for the proposed anesthesia with the patient or authorized representative who has indicated his/her understanding and acceptance.   Dental advisory given  Plan Discussed with: CRNA  Anesthesia Plan Comments:         Anesthesia Quick Evaluation

## 2014-11-26 NOTE — Progress Notes (Signed)
ANTIBIOTIC CONSULT NOTE - INITIAL  Pharmacy Consult for Vancomycin and Zosyn  Indication: rule out sepsis  No Known Allergies  Patient Measurements: Height: 5' 10.8" (179.8 cm) Weight: 287 lb 1.6 oz (130.228 kg) IBW/kg (Calculated) : 74.84 Adjusted Body Weight: 100 kg  Vital Signs: Temp: 99.6 F (37.6 C) (06/28 0221) Temp Source: Oral (06/28 0221) BP: 142/86 mmHg (06/28 0221) Pulse Rate: 80 (06/28 0230) Intake/Output from previous day:   Intake/Output from this shift:    Labs:  Recent Labs  11/25/14 2155  WBC 11.1*  HGB 13.7  PLT 172  CREATININE 0.93   Estimated Creatinine Clearance: 137.6 mL/min (by C-G formula based on Cr of 0.93). No results for input(s): VANCOTROUGH, VANCOPEAK, VANCORANDOM, GENTTROUGH, GENTPEAK, GENTRANDOM, TOBRATROUGH, TOBRAPEAK, TOBRARND, AMIKACINPEAK, AMIKACINTROU, AMIKACIN in the last 72 hours.   Microbiology: No results found for this or any previous visit (from the past 720 hour(s)).  Medical History: History reviewed. No pertinent past medical history.  Medications:  Prescriptions prior to admission  Medication Sig Dispense Refill Last Dose  . ibuprofen (ADVIL,MOTRIN) 200 MG tablet Take 400-800 mg by mouth every 6 (six) hours as needed for mild pain.   11/24/2014 at Unknown time   Assessment: 44 y.o. male with L buttock abcess, possible sepsis, for empiric antibiotics   Vancomycin 1 g IV given in ED at 0115  Goal of Therapy:  Vancomycin trough level 15-20 mcg/ml  Plan:  Vancomycin 1250 mg IV q8h Zosyn 3.375 g IV q8h    Jakai Risse, Bronson Curb 11/26/2014,3:31 AM

## 2014-11-26 NOTE — OR Nursing (Signed)
BOXERS SENT IN LABELED BAG TO PACU WITH PATIENT

## 2014-11-26 NOTE — ED Notes (Signed)
Taken for CT at this time.

## 2014-11-26 NOTE — Op Note (Signed)
Preop diagnosis: Left buttock abscess Postop diagnosis: Same Procedure performed: Incision and drainage of left buttock abscess Surgeon:Saketh Daubert K. Anesthesia: Gen. endotracheal Indications: This is a 44 year old male who presented with several days of worsening left buttock pain associated with nausea diarrhea fever and chills. The patient is a heavy drinker and smokes marijuana. He smokes 2 packs of cigarettes per day. He has never had a similar type of abscess. He was evaluated in the emergency department and was noted to have an a 8.6 x 2.4 x 4.2 cm abscess in the left buttock. This seems to be adjacent to the rectum but there is no clear communication with the rectum. There is some surrounding cellulitis. He presents now for surgical drainage.  Description of procedure: The patient is brought to the operating room and placed in supine position on the stretcher. After an adequate level of general anesthesia was obtained, he was moved to a prone position on the table with appropriate padding. His buttocks were retracted with tape. The area around his perineum and perirectal regions were prepped with Betadine and draped sterile fashion. There's firm induration on the left side. A timeout was taken to ensure the proper patient and proper procedure. I made a 3 cm round incision directly over the area of the greatest fluctuance. We encountered a large amount of purulent fluid. Cultures were sent. We open the abscess cavity and entered a long thin tunnel.  This seems to track up close to the rectum. However placed a retractor in the rectum and no drainage was noted into the rectum itself. It did not seem to be any communication from the abscess into the rectum. We irrigated the abscess cavity thoroughly We placed a a half-inch Penrose drain down into the depth of the abscess cavity. This was secured to the skin with 2 interrupted 2-0 nylon sutures. The opening of the wound was packed with a saline  moistened 4 x 4. A dry dressing was applied. The patient was then moved back to a supine position. He was extubated and brought to recovery in stable condition. All sponge, initially, and needle counts are correct.   Imogene Burn. Georgette Dover, MD, [facility]  General/ Trauma Surgery  11/26/2014 9:57 AM

## 2014-11-26 NOTE — Consult Note (Signed)
Reason for Consult:buttock abscess Referring Physician: Dr Arnell Sieving is an 44 y.o. male.  HPI: 44 yo male comes to the ED for worsening buttock pain. He reports chronic issues with loose/semi formed stools on a daily basis. He reports about 3-4 years ago he started to have a combination of blood on the toilet paper, stool, commode. Also during that time he reports suffer pain when having a BM. He confirms that it felt like a knife cutting him then would throb for awhile. No weight loss. He states about 3 weeks ago it felt like he was starting to get a boil on his buttock. About 3 days ago he states he started to have intermittent n/diarrhea/fever/chills. No dysuria. No colonoscopy. Doesn't see doctors regularly. No prior rectal procedures. 2ppd smoking. Occasional THC. Drinks alcohol daily. He asks if his liver numbers are elevated bc he drinks daily - about four 40s/day.   History reviewed. No pertinent past medical history.  Past Surgical History  Procedure Laterality Date  . Knee arthroscopy      No family history on file.  Social History:  reports that he has been smoking.  He does not have any smokeless tobacco history on file. He reports that he drinks alcohol. He reports that he uses illicit drugs (Marijuana).  Allergies: No Known Allergies  Medications: I have reviewed the patient's current medications.  Results for orders placed or performed during the hospital encounter of 11/25/14 (from the past 48 hour(s))  CBC     Status: Abnormal   Collection Time: 11/25/14  9:55 PM  Result Value Ref Range   WBC 11.1 (H) 4.0 - 10.5 K/uL   RBC 4.18 (L) 4.22 - 5.81 MIL/uL   Hemoglobin 13.7 13.0 - 17.0 g/dL   HCT 39.8 39.0 - 52.0 %   MCV 95.2 78.0 - 100.0 fL   MCH 32.8 26.0 - 34.0 pg   MCHC 34.4 30.0 - 36.0 g/dL   RDW 12.8 11.5 - 15.5 %   Platelets 172 150 - 400 K/uL  Comprehensive metabolic panel     Status: Abnormal   Collection Time: 11/25/14  9:55 PM  Result Value Ref  Range   Sodium 135 135 - 145 mmol/L   Potassium 4.2 3.5 - 5.1 mmol/L   Chloride 100 (L) 101 - 111 mmol/L   CO2 26 22 - 32 mmol/L   Glucose, Bld 128 (H) 65 - 99 mg/dL   BUN 12 6 - 20 mg/dL   Creatinine, Ser 0.93 0.61 - 1.24 mg/dL   Calcium 8.7 (L) 8.9 - 10.3 mg/dL   Total Protein 6.7 6.5 - 8.1 g/dL   Albumin 3.6 3.5 - 5.0 g/dL   AST 44 (H) 15 - 41 U/L   ALT 73 (H) 17 - 63 U/L   Alkaline Phosphatase 72 38 - 126 U/L   Total Bilirubin 0.6 0.3 - 1.2 mg/dL   GFR calc non Af Amer >60 >60 mL/min   GFR calc Af Amer >60 >60 mL/min    Comment: (NOTE) The eGFR has been calculated using the CKD EPI equation. This calculation has not been validated in all clinical situations. eGFR's persistently <60 mL/min signify possible Chronic Kidney Disease.    Anion gap 9 5 - 15  Differential     Status: Abnormal   Collection Time: 11/25/14  9:55 PM  Result Value Ref Range   Neutrophils Relative % 79 (H) 43 - 77 %   Neutro Abs 8.3 (H) 1.7 - 7.7 K/uL  Lymphocytes Relative 15 12 - 46 %   Lymphs Abs 1.6 0.7 - 4.0 K/uL   Monocytes Relative 5 3 - 12 %   Monocytes Absolute 0.5 0.1 - 1.0 K/uL   Eosinophils Relative 1 0 - 5 %   Eosinophils Absolute 0.1 0.0 - 0.7 K/uL   Basophils Relative 0 0 - 1 %   Basophils Absolute 0.0 0.0 - 0.1 K/uL  Type and screen     Status: None   Collection Time: 11/25/14 10:00 PM  Result Value Ref Range   ABO/RH(D) A POS    Antibody Screen NEG    Sample Expiration 11/28/2014   ABO/Rh     Status: None (Preliminary result)   Collection Time: 11/25/14 10:00 PM  Result Value Ref Range   ABO/RH(D) A POS   I-Stat CG4 Lactic Acid, ED     Status: Abnormal   Collection Time: 11/25/14 10:10 PM  Result Value Ref Range   Lactic Acid, Venous 2.19 (HH) 0.5 - 2.0 mmol/L   Comment NOTIFIED PHYSICIAN   Urinalysis, Routine w reflex microscopic (not at [facility])     Status: None   Collection Time: 11/25/14 10:27 PM  Result Value Ref Range   Color, Urine YELLOW YELLOW   APPearance CLEAR  CLEAR   Specific Gravity, Urine 1.018 1.005 - 1.030   pH 5.0 5.0 - 8.0   Glucose, UA NEGATIVE NEGATIVE mg/dL   Hgb urine dipstick NEGATIVE NEGATIVE   Bilirubin Urine NEGATIVE NEGATIVE   Ketones, ur NEGATIVE NEGATIVE mg/dL   Protein, ur NEGATIVE NEGATIVE mg/dL   Urobilinogen, UA 0.2 0.0 - 1.0 mg/dL   Nitrite NEGATIVE NEGATIVE   Leukocytes, UA NEGATIVE NEGATIVE    Comment: MICROSCOPIC NOT DONE ON URINES WITH NEGATIVE PROTEIN, BLOOD, LEUKOCYTES, NITRITE, OR GLUCOSE <1000 mg/dL.    Ct Abdomen Pelvis W Contrast  11/26/2014   CLINICAL DATA:  Initial evaluation for acute rectal bleeding, concern for perirectal abscess.  EXAM: CT ABDOMEN AND PELVIS WITH CONTRAST  TECHNIQUE: Multidetector CT imaging of the abdomen and pelvis was performed using the standard protocol following bolus administration of intravenous contrast.  CONTRAST:  173m OMNIPAQUE IOHEXOL 300 MG/ML  SOLN  COMPARISON:  None.  FINDINGS: AE 5 mm nodule present within the left lower lobe (series 205, image 5). The visualized lung bases are otherwise clear.  Diffuse hypoattenuation of the liver is consistent with steatosis. Focal fatty sparing seen within the subcapsular left hepatic lobe. Liver otherwise unremarkable. Gallbladder within normal limits. No biliary dilatation. The spleen, adrenal glands, and pancreas demonstrate a normal contrast enhanced appearance.  Kidneys are equal in size with symmetric enhancement. No nephrolithiasis, hydronephrosis, or focal enhancing renal mass.  The stomach is within normal limits. No evidence for bowel obstruction. Appendix is normal. No acute inflammatory changes seen about the bowels.  There is a in loculated hypodense collection measuring 8.6 x 2.4 x 4.2 cm within the left paramedian subcu gluteal fat (series 201, image 103). This closely approximates the left lateral aspect of the rectum near the anal verge. There is overlying inflammatory stranding with skin thickening, suggestive of cellulitis. No  dissecting soft tissue emphysema identified.  Bladder within normal limits.  Prostate normal.  No free air or fluid. Small fat containing bilateral inguinal hernias noted. No pathologically enlarged intra-abdominal or pelvic lymph nodes identified. Normal intravascular enhancement seen throughout the intra-abdominal aorta. No aneurysm.  No acute osseous abnormality. No worrisome lytic or blastic osseous lesion.  IMPRESSION: 1. 8.6 x 2.4 x 4.2 cm  abscess within the left paramedian subcutaneous gluteal fat, closely approximating the rectum near the level of the anal verge. Overlying inflammatory changes suggestive of associated cellulitis. 2. No other acute intra-abdominal or pelvic process identified. 3. Hepatic steatosis. 4. 5 mm left lower lobe pulmonary nodule, indeterminate. If the patient is at high risk for bronchogenic carcinoma, follow-up chest CT at 6-12 months is recommended. If the patient is at low risk for bronchogenic carcinoma, follow-up chest CT at 12 months is recommended. This recommendation follows the consensus statement: Guidelines for Management of Small Pulmonary Nodules Detected on CT Scans: A Statement from the [employer] as published in Radiology 2005;237:395-400.   Electronically Signed   By: Jeannine Boga M.D.   On: 11/26/2014 01:16    Review of Systems  Constitutional: Positive for fever and chills. Negative for weight loss.  HENT: Negative for nosebleeds.   Eyes: Negative for blurred vision.  Respiratory: Negative for shortness of breath.   Cardiovascular: Positive for chest pain and leg swelling (mid calf). Negative for palpitations, orthopnea and PND.       Denies DOE  Gastrointestinal: Positive for nausea, vomiting, diarrhea and blood in stool.  Genitourinary: Negative for dysuria and hematuria.  Musculoskeletal: Negative.   Skin: Negative for itching and rash.  Neurological: Negative for dizziness, focal weakness, seizures, loss of consciousness and  headaches.       Denies TIAs, amaurosis fugax  Endo/Heme/Allergies: Does not bruise/bleed easily.  Psychiatric/Behavioral: The patient is not nervous/anxious.        Drinks four 40s per day; +THC   Blood pressure 128/73, pulse 86, temperature 100.1 F (37.8 C), temperature source Oral, resp. rate 25, height _0  (1.753 m), weight 131.543 kg (290 lb), SpO2 97 %. Physical Exam  Vitals reviewed. Constitutional: He is oriented to person, place, and time. He appears well-developed and well-nourished. No distress.  Obese; stutters  HENT:  Head: Normocephalic and atraumatic.  Right Ear: External ear normal.  Left Ear: External ear normal.  Eyes: Conjunctivae are normal. No scleral icterus.  Neck: Normal range of motion. Neck supple. No tracheal deviation present. No thyromegaly present.  Cardiovascular: Normal rate and normal heart sounds.   Respiratory: Effort normal and breath sounds normal. No stridor. No respiratory distress. He has no wheezes.  GI: Soft. Normal appearance. There is no tenderness. There is no rigidity and no guarding.  Genitourinary:  Has upper gluteal cleft pilonidal sinus without signs of infection. On medial Left buttock - significant induration, mild cellulitis, TTP, induration about 8cm x 4cm; rectal deferred due to pain; no obvious prolapsed hemorrhoid.   Musculoskeletal: He exhibits no edema or tenderness.  Lymphadenopathy:    He has no cervical adenopathy.  Neurological: He is alert and oriented to person, place, and time. He exhibits normal muscle tone.  Skin: Skin is warm and dry. No rash noted. He is not diaphoretic. No erythema. No pallor.  Psychiatric: He has a normal mood and affect. His behavior is normal. Judgment and thought content normal.    Assessment/Plan: Large Left buttock abscess Pilonidal sinus - no evidence of infection on exam Elevated BP Alcohol dependence Left lower lung pulmonary nodule Chest pain Leg swelling Elevated transaminases  - alcohol vs fatty liver  i think pt would be best served given his current issues and to establish a medical home.  He will need EUA, incision and drainage of perirectal abscess later today by Dr Georgette Dover. Discussed risk/benefits with pt (bleeding, infection, need for open wound, recurrence, need  for addl procedures, fistula, perioperative pulm/cardiac issues, blood clots)  IV abx NPO x meds LLL pulm nodule per medicine CIWA protocol  Leighton Ruff. Redmond Pulling, MD, FACS General, Bariatric, & Minimally Invasive Surgery [facility], [place]   [facility] M 11/26/2014, 1:34 AM     Dr

## 2014-11-26 NOTE — Progress Notes (Signed)
Report given to Sharyn Lull, RN in short stay

## 2014-11-26 NOTE — Anesthesia Postprocedure Evaluation (Signed)
  Anesthesia Post-op Note  Patient: Jon Wade  Procedure(s) Performed: Procedure(s): Incision and Drainage of Left Buttock Abscess (Left)  Patient Location: PACU  Anesthesia Type:General  Level of Consciousness: awake and alert   Airway and Oxygen Therapy: Patient Spontanous Breathing  Post-op Pain: none  Post-op Assessment: Post-op Vital signs reviewed, Patient's Cardiovascular Status Stable and Respiratory Function Stable  Post-op Vital Signs: Reviewed  Filed Vitals:   11/26/14 1021  BP: 167/83  Pulse: 80  Temp: 37 C  Resp: 23    Complications: No apparent anesthesia complications

## 2014-11-26 NOTE — ED Provider Notes (Signed)
Discussed with Dr. Redmond Pulling. Asked to have medicine team and admitted for chronic alcohol abuse and episodic atypical chest pain. Currently pain-free. Dr. Hal Hope will admit to telemetry bed.  Julianne Rice, MD 11/26/14 [phone]

## 2014-11-27 ENCOUNTER — Encounter (HOSPITAL_COMMUNITY): Payer: Self-pay | Admitting: Surgery

## 2014-11-27 DIAGNOSIS — K611 Rectal abscess: Secondary | ICD-10-CM

## 2014-11-27 LAB — CBC
HCT: 39.2 % (ref 39.0–52.0)
Hemoglobin: 13.3 g/dL (ref 13.0–17.0)
MCH: 32 pg (ref 26.0–34.0)
MCHC: 33.9 g/dL (ref 30.0–36.0)
MCV: 94.2 fL (ref 78.0–100.0)
Platelets: 162 10*3/uL (ref 150–400)
RBC: 4.16 MIL/uL — ABNORMAL LOW (ref 4.22–5.81)
RDW: 12.8 % (ref 11.5–15.5)
WBC: 8.2 10*3/uL (ref 4.0–10.5)

## 2014-11-27 LAB — HIV ANTIBODY (ROUTINE TESTING W REFLEX): HIV Screen 4th Generation wRfx: NONREACTIVE

## 2014-11-27 LAB — HEMOGLOBIN A1C
HEMOGLOBIN A1C: 5.7 % — AB (ref 4.8–5.6)
Mean Plasma Glucose: 117 mg/dL

## 2014-11-27 LAB — BASIC METABOLIC PANEL
ANION GAP: 10 (ref 5–15)
BUN: 7 mg/dL (ref 6–20)
CO2: 26 mmol/L (ref 22–32)
Calcium: 8.5 mg/dL — ABNORMAL LOW (ref 8.9–10.3)
Chloride: 99 mmol/L — ABNORMAL LOW (ref 101–111)
Creatinine, Ser: 0.87 mg/dL (ref 0.61–1.24)
GFR calc Af Amer: >60 mL/min (ref >60)
GFR calc non Af Amer: >60 mL/min (ref >60)
GLUCOSE: 90 mg/dL (ref 65–99)
POTASSIUM: 3.9 mmol/L (ref 3.5–5.1)
SODIUM: 135 mmol/L (ref 135–145)

## 2014-11-27 MED ORDER — OXYCODONE-ACETAMINOPHEN 5-325 MG PO TABS: 1.0000 | ORAL_TABLET | ORAL | Status: DC | PRN

## 2014-11-27 MED ORDER — OXYCODONE-ACETAMINOPHEN 5-325 MG PO TABS
1.0000 | ORAL_TABLET | ORAL | Status: DC | PRN
  Administered 2014-11-27 (×2): 2 via ORAL
  Administered 2014-11-28 (×2): 1 via ORAL
  Filled 2014-11-27 (×2): qty 2
  Filled 2014-11-27 (×2): qty 1

## 2014-11-27 MED ORDER — SODIUM CHLORIDE 0.9 % IV SOLN
INTRAVENOUS | Status: DC
  Administered 2014-11-27 – 2014-11-28 (×2): via INTRAVENOUS

## 2014-11-27 MED ORDER — POLYETHYLENE GLYCOL 3350 17 G PO PACK
17.0000 g | PACK | Freq: Every day | ORAL | Status: DC
  Filled 2014-11-27 (×3): qty 1

## 2014-11-27 NOTE — Progress Notes (Signed)
[facility] PROGRESS NOTE  Carlson Belland EGB:[id number] DOB: 11-05-1970 DOA: 11/25/2014 PCP: No PCP Per Patient  Assessment/Plan:  Sepsis from perirectal abscess - appreciate surgery consult. S/P I and 6-28.  Continue with IV antibiotics.  BID dressing changes.  Follow culture results.   History of alcohol abuse - on CIWA. No evidence of withdrawal.   Atypical chest pain - patient has pain mostly cramping in nature. Troponin negative. Currently chest pain-free.  Mildly elevated LFTs most likely secondary to alcoholism - follow LFTs. Further workup as outpatient.  Prediabetic;  Nutrition consulted for diet recommendation.  Hb A1c 5.7.   Code Status: Full Code.  Family Communication: care discussed with patient.  Disposition Plan: Remain inpatient for IV antibiotics. Plan per surgery   Consultants:  Surgery  Procedures:  I and D  Antibiotics:  Vancomycin 6-28  Zosyn 6-28  HPI/Subjective: Having pain , wound site.    Objective: Filed Vitals:   11/27/14 0523  BP: 135/85  Pulse: 70  Temp: 98.6 F (37 C)  Resp: 18    Intake/Output Summary (Last 24 hours) at 11/27/14 0847 Last data filed at 11/27/14 0840  Gross per 24 hour  Intake 4992.08 ml  Output   2550 ml  Net 2442.08 ml   Filed Weights   11/25/14 1909 11/26/14 0221 11/27/14 0523  Weight: 131.543 kg (290 lb) 130.228 kg (287 lb 1.6 oz) 131.3 kg (289 lb 7.4 oz)    Exam:   General:  Alert in no dsitress  Cardiovascular: S 1, S 2 RRR  Respiratory: CTA  Abdomen: Bs present.   Musculoskeletal: no edema  Skin; perirectal area wound with packing, dressing.   Data Reviewed: Basic Metabolic Panel:  Recent Labs Lab 11/25/14 2155 11/26/14 0438 11/27/14 0530  NA 135 134* 135  K 4.2 4.0 3.9  CL 100* 100* 99*  CO2 26 26 26   GLUCOSE 128* 91 90  BUN 12 10 7   CREATININE 0.93 0.91 0.87  CALCIUM 8.7* 8.3* 8.5*   Liver Function Tests:  Recent Labs Lab 11/25/14 2155 11/26/14 0438   AST 44* 34  ALT 73* 66*  ALKPHOS 72 69  BILITOT 0.6 0.7  PROT 6.7 6.6  ALBUMIN 3.6 3.4*   No results for input(s): LIPASE, AMYLASE in the last 168 hours. No results for input(s): AMMONIA in the last 168 hours. CBC:  Recent Labs Lab 11/25/14 2155 11/26/14 0438 11/27/14 0530  WBC 11.1* 9.5 8.2  NEUTROABS 8.3* 7.0  --   HGB 13.7 13.4 13.3  HCT 39.8 39.5 39.2  MCV 95.2 94.0 94.2  PLT 172 160 162   Cardiac Enzymes:  Recent Labs Lab 11/26/14 0438 11/26/14 1145 11/26/14 1836  TROPONINI <0.03 <0.03 <0.03   BNP (last 3 results) No results for input(s): BNP in the last 8760 hours.  ProBNP (last 3 results) No results for input(s): PROBNP in the last 8760 hours.  CBG:  Recent Labs Lab 11/26/14 0803  GLUCAP 105*    Recent Results (from the past 240 hour(s))  Surgical pcr screen     Status: None   Collection Time: 11/26/14  5:19 AM  Result Value Ref Range Status   MRSA, PCR NEGATIVE NEGATIVE Final   Staphylococcus aureus NEGATIVE NEGATIVE Final    Comment:        The Xpert SA Assay (FDA approved for NASAL specimens in patients over 84 years of age), is one component of a comprehensive surveillance program.  Test performance has been validated by [facility] for patients  greater than or equal to 74 year old. It is not intended to diagnose infection nor to guide or monitor treatment.   Anaerobic culture     Status: None (Preliminary result)   Collection Time: 11/26/14 12:56 PM  Result Value Ref Range Status   Specimen Description ABSCESS BUTTOCKS LEFT  Final   Special Requests PT ON ZOSYN/VANCOMYCIN  Final   Gram Stain   Final    NO WBC SEEN NO SQUAMOUS EPITHELIAL CELLS SEEN NO ORGANISMS SEEN Performed at [facility]    Culture PENDING  Incomplete   Report Status PENDING  Incomplete  Culture, routine-abscess     Status: None (Preliminary result)   Collection Time: 11/26/14 12:56 PM  Result Value Ref Range Status   Specimen Description  ABSCESS BUTTOCKS LEFT  Final   Special Requests PT ON ZOSYN/VANCOMYCIN  Final   Gram Stain   Final    NO WBC SEEN NO SQUAMOUS EPITHELIAL CELLS SEEN NO ORGANISMS SEEN Performed at [facility]    Culture NO GROWTH Performed at [facility]   Final   Report Status PENDING  Incomplete     Studies: Dg Chest 2 View  11/26/2014   CLINICAL DATA:  Mid chest pain tonight, blood in stool.  EXAM: CHEST  2 VIEW  COMPARISON:  Included lung bases from CT abdomen earlier this day.  FINDINGS: The cardiomediastinal contours are normal. The lungs are clear. Small pulmonary nodule on prior CT is not seen radiographically. Pulmonary vasculature is normal. No consolidation, pleural effusion, or pneumothorax. No acute osseous abnormalities are seen. Mild degenerative change in the spine.  IMPRESSION: No acute pulmonary process.   Electronically Signed   By: Jeb Levering M.D.   On: 11/26/2014 02:56   Ct Abdomen Pelvis W Contrast  11/26/2014   CLINICAL DATA:  Initial evaluation for acute rectal bleeding, concern for perirectal abscess.  EXAM: CT ABDOMEN AND PELVIS WITH CONTRAST  TECHNIQUE: Multidetector CT imaging of the abdomen and pelvis was performed using the standard protocol following bolus administration of intravenous contrast.  CONTRAST:  142mL OMNIPAQUE IOHEXOL 300 MG/ML  SOLN  COMPARISON:  None.  FINDINGS: AE 5 mm nodule present within the left lower lobe (series 205, image 5). The visualized lung bases are otherwise clear.  Diffuse hypoattenuation of the liver is consistent with steatosis. Focal fatty sparing seen within the subcapsular left hepatic lobe. Liver otherwise unremarkable. Gallbladder within normal limits. No biliary dilatation. The spleen, adrenal glands, and pancreas demonstrate a normal contrast enhanced appearance.  Kidneys are equal in size with symmetric enhancement. No nephrolithiasis, hydronephrosis, or focal enhancing renal mass.  The stomach is within normal limits.  No evidence for bowel obstruction. Appendix is normal. No acute inflammatory changes seen about the bowels.  There is a in loculated hypodense collection measuring 8.6 x 2.4 x 4.2 cm within the left paramedian subcu gluteal fat (series 201, image 103). This closely approximates the left lateral aspect of the rectum near the anal verge. There is overlying inflammatory stranding with skin thickening, suggestive of cellulitis. No dissecting soft tissue emphysema identified.  Bladder within normal limits.  Prostate normal.  No free air or fluid. Small fat containing bilateral inguinal hernias noted. No pathologically enlarged intra-abdominal or pelvic lymph nodes identified. Normal intravascular enhancement seen throughout the intra-abdominal aorta. No aneurysm.  No acute osseous abnormality. No worrisome lytic or blastic osseous lesion.  IMPRESSION: 1. 8.6 x 2.4 x 4.2 cm abscess within the left paramedian subcutaneous gluteal fat,  closely approximating the rectum near the level of the anal verge. Overlying inflammatory changes suggestive of associated cellulitis. 2. No other acute intra-abdominal or pelvic process identified. 3. Hepatic steatosis. 4. 5 mm left lower lobe pulmonary nodule, indeterminate. If the patient is at high risk for bronchogenic carcinoma, follow-up chest CT at 6-12 months is recommended. If the patient is at low risk for bronchogenic carcinoma, follow-up chest CT at 12 months is recommended. This recommendation follows the consensus statement: Guidelines for Management of Small Pulmonary Nodules Detected on CT Scans: A Statement from the [employer] as published in Radiology 2005;237:395-400.   Electronically Signed   By: Jeannine Boga M.D.   On: 11/26/2014 01:16    Scheduled Meds: . folic acid  1 mg Oral Daily  . LORazepam  0-4 mg Intravenous 4 times per day   Followed by  . [START ON 11/28/2014] LORazepam  0-4 mg Intravenous Q12H  . multivitamin with minerals  1 tablet Oral  Daily  . piperacillin-tazobactam (ZOSYN)  IV  3.375 g Intravenous 3 times per day  . pneumococcal 23 valent vaccine  0.5 mL Intramuscular Tomorrow-1000  . thiamine  100 mg Oral Daily   Or  . thiamine  100 mg Intravenous Daily  . vancomycin  1,250 mg Intravenous Q8H   Continuous Infusions: . sodium chloride 1,000 mL (11/27/14 0358)  . lactated ringers 10 mL/hr at 11/26/14 6599    Principal Problem:   Sepsis Active Problems:   Perirectal abscess   Alcohol abuse   Atypical chest pain    Time spent: 35 minutes.     Niel Hummer A  [facility] Pager [phone]. If 7PM-7AM, please contact night-coverage at [email or web address], password [facility] 11/27/2014, 8:47 AM  LOS: 1 day

## 2014-11-27 NOTE — Progress Notes (Signed)
Patient ID: Jon Wade, male   DOB: 06-24-1970, 44 y.o.   MRN: [id number] 1 Day Post-Op  Subjective: Pt feels better this morning, but still tender.  Objective: Vital signs in last 24 hours: Temp:  [97.6 F (36.4 C)-98.6 F (37 C)] 98.6 F (37 C) (06/29 0523) Pulse Rate:  [66-85] 70 (06/29 0523) Resp:  [14-23] 18 (06/29 0523) BP: (124-167)/(71-85) 135/85 mmHg (06/29 0523) SpO2:  [95 %-100 %] 97 % (06/29 0523) Weight:  [131.3 kg (289 lb 7.4 oz)] 131.3 kg (289 lb 7.4 oz) (06/29 0523) Last BM Date: 11/25/14  Intake/Output from previous day: 06/28 0701 - 06/29 0700 In: 2373.8 [P.O.:480; I.V.:1593.8; IV Piggyback:300] Out: 2600 [Urine:2600] Intake/Output this shift: Total I/O In: 2618.3 [P.O.:240; I.V.:1778.3; IV Piggyback:600] Out: 400 [Urine:400]  PE: Skin: wound is clean and packed with penrose and gauze.  Induration and erythema is minimal.  Mostly just serous drainage on dressing  Lab Results:   Recent Labs  11/26/14 0438 11/27/14 0530  WBC 9.5 8.2  HGB 13.4 13.3  HCT 39.5 39.2  PLT 160 162   BMET  Recent Labs  11/26/14 0438 11/27/14 0530  NA 134* 135  K 4.0 3.9  CL 100* 99*  CO2 26 26  GLUCOSE 91 90  BUN 10 7  CREATININE 0.91 0.87  CALCIUM 8.3* 8.5*   PT/INR  Recent Labs  11/26/14 0438  LABPROT 13.7  INR 1.03   CMP     Component Value Date/Time   NA 135 11/27/2014 0530   K 3.9 11/27/2014 0530   CL 99* 11/27/2014 0530   CO2 26 11/27/2014 0530   GLUCOSE 90 11/27/2014 0530   BUN 7 11/27/2014 0530   CREATININE 0.87 11/27/2014 0530   CALCIUM 8.5* 11/27/2014 0530   PROT 6.6 11/26/2014 0438   ALBUMIN 3.4* 11/26/2014 0438   AST 34 11/26/2014 0438   ALT 66* 11/26/2014 0438   ALKPHOS 69 11/26/2014 0438   BILITOT 0.7 11/26/2014 0438   GFRNONAA >60 11/27/2014 0530   GFRAA >60 11/27/2014 0530   Lipase  No results found for: LIPASE     Studies/Results: Dg Chest 2 View  11/26/2014   CLINICAL DATA:  Mid chest pain tonight, blood in stool.   EXAM: CHEST  2 VIEW  COMPARISON:  Included lung bases from CT abdomen earlier this day.  FINDINGS: The cardiomediastinal contours are normal. The lungs are clear. Small pulmonary nodule on prior CT is not seen radiographically. Pulmonary vasculature is normal. No consolidation, pleural effusion, or pneumothorax. No acute osseous abnormalities are seen. Mild degenerative change in the spine.  IMPRESSION: No acute pulmonary process.   Electronically Signed   By: Jeb Levering M.D.   On: 11/26/2014 02:56   Ct Abdomen Pelvis W Contrast  11/26/2014   CLINICAL DATA:  Initial evaluation for acute rectal bleeding, concern for perirectal abscess.  EXAM: CT ABDOMEN AND PELVIS WITH CONTRAST  TECHNIQUE: Multidetector CT imaging of the abdomen and pelvis was performed using the standard protocol following bolus administration of intravenous contrast.  CONTRAST:  164mL OMNIPAQUE IOHEXOL 300 MG/ML  SOLN  COMPARISON:  None.  FINDINGS: AE 5 mm nodule present within the left lower lobe (series 205, image 5). The visualized lung bases are otherwise clear.  Diffuse hypoattenuation of the liver is consistent with steatosis. Focal fatty sparing seen within the subcapsular left hepatic lobe. Liver otherwise unremarkable. Gallbladder within normal limits. No biliary dilatation. The spleen, adrenal glands, and pancreas demonstrate a normal contrast enhanced appearance.  Kidneys  are equal in size with symmetric enhancement. No nephrolithiasis, hydronephrosis, or focal enhancing renal mass.  The stomach is within normal limits. No evidence for bowel obstruction. Appendix is normal. No acute inflammatory changes seen about the bowels.  There is a in loculated hypodense collection measuring 8.6 x 2.4 x 4.2 cm within the left paramedian subcu gluteal fat (series 201, image 103). This closely approximates the left lateral aspect of the rectum near the anal verge. There is overlying inflammatory stranding with skin thickening, suggestive of  cellulitis. No dissecting soft tissue emphysema identified.  Bladder within normal limits.  Prostate normal.  No free air or fluid. Small fat containing bilateral inguinal hernias noted. No pathologically enlarged intra-abdominal or pelvic lymph nodes identified. Normal intravascular enhancement seen throughout the intra-abdominal aorta. No aneurysm.  No acute osseous abnormality. No worrisome lytic or blastic osseous lesion.  IMPRESSION: 1. 8.6 x 2.4 x 4.2 cm abscess within the left paramedian subcutaneous gluteal fat, closely approximating the rectum near the level of the anal verge. Overlying inflammatory changes suggestive of associated cellulitis. 2. No other acute intra-abdominal or pelvic process identified. 3. Hepatic steatosis. 4. 5 mm left lower lobe pulmonary nodule, indeterminate. If the patient is at high risk for bronchogenic carcinoma, follow-up chest CT at 6-12 months is recommended. If the patient is at low risk for bronchogenic carcinoma, follow-up chest CT at 12 months is recommended. This recommendation follows the consensus statement: Guidelines for Management of Small Pulmonary Nodules Detected on CT Scans: A Statement from the [place] as published in Radiology 2005;237:395-400.   Electronically Signed   By: Jeannine Boga M.D.   On: 11/26/2014 01:16    Anti-infectives: Anti-infectives    Start     Dose/Rate Route Frequency Ordered Stop   11/26/14 0600  piperacillin-tazobactam (ZOSYN) IVPB 3.375 g     3.375 g 12.5 mL/hr over 240 Minutes Intravenous 3 times per day 11/26/14 0337     11/26/14 0400  vancomycin (VANCOCIN) 1,250 mg in sodium chloride 0.9 % 250 mL IVPB     1,250 mg 166.7 mL/hr over 90 Minutes Intravenous Every 8 hours 11/26/14 0337     11/26/14 0045  vancomycin (VANCOCIN) IVPB 1000 mg/200 mL premix     1,000 mg 200 mL/hr over 60 Minutes Intravenous  Once 11/26/14 0043 11/26/14 0212   11/26/14 0015  piperacillin-tazobactam (ZOSYN) IVPB 3.375 g      3.375 g 12.5 mL/hr over 240 Minutes Intravenous  Once 11/26/14 0005 11/26/14 0114   11/26/14 0015  piperacillin-tazobactam (ZOSYN) IVPB 3.375 g  Status:  Discontinued     3.375 g 100 mL/hr over 30 Minutes Intravenous  Once 11/26/14 0007 11/26/14 0007       Assessment/Plan  POD 1, s/p I&D of large perirectal abscess -start BID dressing changes today -cot ABX therapy today , CX no growth yet - regular diet -hopefully home when can tolerate dressing changes with oral pain meds -stool softener added to help with BMs  LOS: 1 day    Bogdan Vivona E 11/27/2014, 8:58 AM Pager: [phone]

## 2014-11-27 NOTE — Progress Notes (Signed)
Responded to spiritual consult to assist patient with Advance Directives.  Patient indicated that he did not request AD and had no clue what it was.  I explained to patient what is was and left form with him to think about it if he decides to consider completing later.

## 2014-11-28 LAB — CBC
HCT: 37.9 % — ABNORMAL LOW (ref 39.0–52.0)
Hemoglobin: 12.7 g/dL — ABNORMAL LOW (ref 13.0–17.0)
MCH: 31.6 pg (ref 26.0–34.0)
MCHC: 33.5 g/dL (ref 30.0–36.0)
MCV: 94.3 fL (ref 78.0–100.0)
Platelets: 165 10*3/uL (ref 150–400)
RBC: 4.02 MIL/uL — ABNORMAL LOW (ref 4.22–5.81)
RDW: 12.7 % (ref 11.5–15.5)
WBC: 5.9 10*3/uL (ref 4.0–10.5)

## 2014-11-28 LAB — BASIC METABOLIC PANEL
Anion gap: 9 (ref 5–15)
BUN: 8 mg/dL (ref 6–20)
CALCIUM: 8.4 mg/dL — AB (ref 8.9–10.3)
CHLORIDE: 101 mmol/L (ref 101–111)
CO2: 27 mmol/L (ref 22–32)
Creatinine, Ser: 0.8 mg/dL (ref 0.61–1.24)
GFR calc Af Amer: >60 mL/min (ref >60)
Glucose, Bld: 100 mg/dL — ABNORMAL HIGH (ref 65–99)
Potassium: 4 mmol/L (ref 3.5–5.1)
Sodium: 137 mmol/L (ref 135–145)

## 2014-11-28 MED ORDER — ADULT MULTIVITAMIN W/MINERALS CH: 1.0000 | ORAL_TABLET | Freq: Every day | ORAL | Status: DC

## 2014-11-28 MED ORDER — CIPROFLOXACIN HCL 500 MG PO TABS: 500.0000 mg | ORAL_TABLET | Freq: Two times a day (BID) | ORAL | Status: DC

## 2014-11-28 MED ORDER — OXYCODONE-ACETAMINOPHEN 5-325 MG PO TABS: 1.0000 | ORAL_TABLET | ORAL | Status: DC | PRN

## 2014-11-28 MED ORDER — POLYETHYLENE GLYCOL 3350 17 G PO PACK: 17.0000 g | PACK | Freq: Every day | ORAL | Status: DC

## 2014-11-28 NOTE — Progress Notes (Signed)
Jon Wade to be D/Wade'd Home per MD order.  Discussed with the patient and all questions fully answered.  VSS. Incision dressing clean dry and intact. Information about home care provided to patient as well as handouts. Drain in place.  IV catheter discontinued intact. Site without signs and symptoms of complications. Dressing and pressure applied.  An After Visit Summary was printed and given to the patient. Patient received prescription.  D/Wade education completed with patient/family including follow up instructions, medication list, d/Wade activities limitations if indicated, with other d/Wade instructions as indicated by MD - patient able to verbalize understanding, all questions fully answered.   Patient instructed to return to ED, call 911, or call MD for any changes in condition.   Patient escorted via [place], and D/Wade home via private auto.  [name], Jon Wade 11/28/2014 11:32 AM

## 2014-11-28 NOTE — Care Management Note (Signed)
Case Management Note  Patient Details  Name: Romney Compean MRN: [id number] Date of Birth: 09-May-1971  Subjective/Objective:   Patient for dc today, per RN and MD patient does not need a HHRN, NCM gave patient information for [facility] clinic, to call and make appt within 2 weeks , and to go pick up his meds at dc from [place] [facility] .                    Action/Plan:   Expected Discharge Date:                  Expected Discharge Plan:  Home/Self Care  In-House Referral:     Discharge planning Services  CM Consult  Post Acute Care Choice:    Choice offered to:     DME Arranged:    DME Agency:     HH Arranged:    [place] Agency:     Status of Service:  Completed, signed off  Medicare Important Message Given:    Date Medicare IM Given:    Medicare IM give by:    Date Additional Medicare IM Given:    Additional Medicare Important Message give by:     If discussed at [place] of Stay Meetings, dates discussed:    Additional Comments:  Zenon Mayo, RN 11/28/2014, 3:10 PM

## 2014-11-28 NOTE — Discharge Instructions (Signed)
Dry gauze over drain After each bowel movement, soak in bathtub with warm soapy water for a few minutes, then shower thoroughly.

## 2014-11-28 NOTE — Progress Notes (Signed)
2 Days Post-Op  Subjective: Less uncomfortable  Still with some drainage around Penrose drain Apprehensive about having bowel movements  Objective: Vital signs in last 24 hours: Temp:  [98.3 F (36.8 C)-99.8 F (37.7 C)] 98.6 F (37 C) (06/30 0454) Pulse Rate:  [68-73] 68 (06/30 0454) Resp:  [16-18] 16 (06/30 0454) BP: (124-147)/(64-85) 147/85 mmHg (06/30 0454) SpO2:  [98 %-99 %] 98 % (06/30 0454) Weight:  [130.182 kg (287 lb)] 130.182 kg (287 lb) (06/30 0458) Last BM Date: 11/27/14  Intake/Output from previous day: 06/29 0701 - 06/30 0700 In: 5798.3 [P.O.:1080; I.V.:3818.3; IV Piggyback:900] Out: 2550 [Urine:2550] Intake/Output this shift:    General appearance: alert, cooperative and no distress Resp: clear to auscultation bilaterally Cardio: regular rate and rhythm, S1, S2 normal, no murmur, click, rub or gallop Perirectal wound - clean; some drainage; decreased cellulitis and induration Penrose drain intact; wound granulating  Lab Results:   Recent Labs  11/27/14 0530 11/28/14 0455  WBC 8.2 5.9  HGB 13.3 12.7*  HCT 39.2 37.9*  PLT 162 165   BMET  Recent Labs  11/27/14 0530 11/28/14 0455  NA 135 137  K 3.9 4.0  CL 99* 101  CO2 26 27  GLUCOSE 90 100*  BUN 7 8  CREATININE 0.87 0.80  CALCIUM 8.5* 8.4*   PT/INR  Recent Labs  11/26/14 0438  LABPROT 13.7  INR 1.03   ABG No results for input(s): PHART, HCO3 in the last 72 hours.  Invalid input(s): PCO2, PO2  Studies/Results: No results found.  Anti-infectives: Anti-infectives    Start     Dose/Rate Route Frequency Ordered Stop   11/26/14 0600  piperacillin-tazobactam (ZOSYN) IVPB 3.375 g     3.375 g 12.5 mL/hr over 240 Minutes Intravenous 3 times per day 11/26/14 0337     11/26/14 0400  vancomycin (VANCOCIN) 1,250 mg in sodium chloride 0.9 % 250 mL IVPB     1,250 mg 166.7 mL/hr over 90 Minutes Intravenous Every 8 hours 11/26/14 0337     11/26/14 0045  vancomycin (VANCOCIN) IVPB 1000  mg/200 mL premix     1,000 mg 200 mL/hr over 60 Minutes Intravenous  Once 11/26/14 0043 11/26/14 0212   11/26/14 0015  piperacillin-tazobactam (ZOSYN) IVPB 3.375 g     3.375 g 12.5 mL/hr over 240 Minutes Intravenous  Once 11/26/14 0005 11/26/14 0114   11/26/14 0015  piperacillin-tazobactam (ZOSYN) IVPB 3.375 g  Status:  Discontinued     3.375 g 100 mL/hr over 30 Minutes Intravenous  Once 11/26/14 0007 11/26/14 0007      Assessment/Plan: s/p Procedure(s): Incision and Drainage of Left Buttock Abscess (Left) No need to continue packing.  Just cover the wound with dry gauze  Patient instructed on sitz baths and shower after each bowel movement.  It will be virtually impossible to keep this area clean considering it's proximity to the anus, but baths and showers will help to keep it clean enough to heal.    Ready for discharge from our standpoint. Follow-up 2 weeks for drain removal.   LOS: 2 days    Desmond Tufano K. 11/28/2014

## 2014-11-28 NOTE — Discharge Summary (Signed)
Physician Discharge Summary  Jon Wade GMW:[id number] DOB: Jul 02, 1970 DOA: 11/25/2014  PCP: No PCP Per Patient  Admit date: 11/25/2014 Discharge date: 11/28/2014  Time spent: 35 minutes  Recommendations for Outpatient Follow-up:  Follow up with Dr Georgette Dover for perirectal abscess.  Need repeat Hb-A1 in 3 month.   Discharge Diagnoses:    Sepsis   Perirectal abscess   Alcohol abuse   Atypical chest pain   Pre diabetic  Discharge Condition: Stable.   Diet recommendation: carb modified.   Filed Weights   11/26/14 0221 11/27/14 0523 11/28/14 0458  Weight: 130.228 kg (287 lb 1.6 oz) 131.3 kg (289 lb 7.4 oz) 130.182 kg (287 lb)    History of present illness:  Jon Wade is a 44 y.o. male with history of alcohol abuse presents to ER because of rectal pain and bleeding. Patient states that over the last couple of months patient has been having rectal pain but over the last 3 days pain worsened with fever chills and worsening rectal bleeding. In the ER patient had CT abdomen and pelvis which shows features consistent with perirectal abscess and on exam patient has induration around the rectal area. On-call surgeon Dr. Redmond Pulling has been consulted and patient has been admitted for further management. Patient also stated some crampy type of chest pain mostly in the epigastric area over the last few days. EKG and chest x-ray are unremarkable and [name] patient is chest pain-free. Patient also had 2-3 episodes of nausea vomiting and abdomen appears benign at this time.   Hospital Course:  Sepsis from perirectal abscess - appreciate surgery consult. S/P I and 6-28.  received IV antibiotics. Vancomycin and Zosyn. He will be discharge on cipro for 10 days.  Follow culture results.  Ok to discharge today per surgery  History of alcohol abuse - on CIWA. No evidence of withdrawal.   Atypical chest pain - patient has pain mostly cramping in nature. Troponin negative. Currently chest  pain-free.  Mildly elevated LFTs most likely secondary to alcoholism - follow LFTs. Further workup as outpatient.  Prediabetic;  Nutrition consulted for diet recommendation.  Hb A1c 5.7.   Procedures:  I and D  Consultations:  Surgery  Discharge Exam: Filed Vitals:   11/28/14 0454  BP: 147/85  Pulse: 68  Temp: 98.6 F (37 C)  Resp: 16    General: Alert in no distress.  Cardiovascular: S 1, S 2 RRR Respiratory: CTA  Discharge Instructions   Discharge Instructions    Diet - low sodium heart healthy    Complete by:  As directed      Increase activity slowly    Complete by:  As directed           Discharge Medication List as of 11/28/2014 11:32 AM    START taking these medications   Details  ciprofloxacin (CIPRO) 500 MG tablet Take 1 tablet (500 mg total) by mouth 2 (two) times daily., Starting 11/28/2014, Until Discontinued, Print    Multiple Vitamin (MULTIVITAMIN WITH MINERALS) TABS tablet Take 1 tablet by mouth daily., Starting 11/28/2014, Until Discontinued, Print    oxyCODONE-acetaminophen (PERCOCET/ROXICET) 5-325 MG per tablet Take 1-2 tablets by mouth every 4 (four) hours as needed for moderate pain., Starting 11/28/2014, Until Discontinued, Print    polyethylene glycol (MIRALAX / GLYCOLAX) packet Take 17 g by mouth daily., Starting 11/28/2014, Until Discontinued, Print      CONTINUE these medications which have NOT CHANGED   Details  ibuprofen (ADVIL,MOTRIN) 200 MG tablet Take 400-800 mg  by mouth every 6 (six) hours as needed for mild pain., Until Discontinued, Historical Med       No Known Allergies Follow-up Information    Follow up with [facility]    .   Why:  please call to make follow up appt in two weeks    Contact information:   [place] [id number] [phone]      Follow up with Maia Petties., MD In 2 weeks.   Specialty:  General Surgery   Contact information:   [place] [phone]        The results of significant diagnostics from this hospitalization (including imaging, microbiology, ancillary and laboratory) are listed below for reference.    Significant Diagnostic Studies: Dg Chest 2 View  11/26/2014   CLINICAL DATA:  Mid chest pain tonight, blood in stool.  EXAM: CHEST  2 VIEW  COMPARISON:  Included lung bases from CT abdomen earlier this day.  FINDINGS: The cardiomediastinal contours are normal. The lungs are clear. Small pulmonary nodule on prior CT is not seen radiographically. Pulmonary vasculature is normal. No consolidation, pleural effusion, or pneumothorax. No acute osseous abnormalities are seen. Mild degenerative change in the spine.  IMPRESSION: No acute pulmonary process.   Electronically Signed   By: Jeb Levering M.D.   On: 11/26/2014 02:56   Ct Abdomen Pelvis W Contrast  11/26/2014   CLINICAL DATA:  Initial evaluation for acute rectal bleeding, concern for perirectal abscess.  EXAM: CT ABDOMEN AND PELVIS WITH CONTRAST  TECHNIQUE: Multidetector CT imaging of the abdomen and pelvis was performed using the standard protocol following bolus administration of intravenous contrast.  CONTRAST:  152mL OMNIPAQUE IOHEXOL 300 MG/ML  SOLN  COMPARISON:  None.  FINDINGS: AE 5 mm nodule present within the left lower lobe (series 205, image 5). The visualized lung bases are otherwise clear.  Diffuse hypoattenuation of the liver is consistent with steatosis. Focal fatty sparing seen within the subcapsular left hepatic lobe. Liver otherwise unremarkable. Gallbladder within normal limits. No biliary dilatation. The spleen, adrenal glands, and pancreas demonstrate a normal contrast enhanced appearance.  Kidneys are equal in size with symmetric enhancement. No nephrolithiasis, hydronephrosis, or focal enhancing renal mass.  The stomach is within normal limits. No evidence for bowel obstruction. Appendix is normal. No acute  inflammatory changes seen about the bowels.  There is a in loculated hypodense collection measuring 8.6 x 2.4 x 4.2 cm within the left paramedian subcu gluteal fat (series 201, image 103). This closely approximates the left lateral aspect of the rectum near the anal verge. There is overlying inflammatory stranding with skin thickening, suggestive of cellulitis. No dissecting soft tissue emphysema identified.  Bladder within normal limits.  Prostate normal.  No free air or fluid. Small fat containing bilateral inguinal hernias noted. No pathologically enlarged intra-abdominal or pelvic lymph nodes identified. Normal intravascular enhancement seen throughout the intra-abdominal aorta. No aneurysm.  No acute osseous abnormality. No worrisome lytic or blastic osseous lesion.  IMPRESSION: 1. 8.6 x 2.4 x 4.2 cm abscess within the left paramedian subcutaneous gluteal fat, closely approximating the rectum near the level of the anal verge. Overlying inflammatory changes suggestive of associated cellulitis. 2. No other acute intra-abdominal or pelvic process identified. 3. Hepatic steatosis. 4. 5 mm left lower lobe pulmonary nodule, indeterminate. If the patient is at high risk for bronchogenic carcinoma, follow-up chest CT at 6-12 months is recommended. If  the patient is at low risk for bronchogenic carcinoma, follow-up chest CT at 12 months is recommended. This recommendation follows the consensus statement: Guidelines for Management of Small Pulmonary Nodules Detected on CT Scans: A Statement from the [place] as published in Radiology 2005;237:395-400.   Electronically Signed   By: Jeannine Boga M.D.   On: 11/26/2014 01:16    Microbiology: Recent Results (from the past 240 hour(s))  Surgical pcr screen     Status: None   Collection Time: 11/26/14  5:19 AM  Result Value Ref Range Status   MRSA, PCR NEGATIVE NEGATIVE Final   Staphylococcus aureus NEGATIVE NEGATIVE Final    Comment:        The  Xpert SA Assay (FDA approved for NASAL specimens in patients over 79 years of age), is one component of a comprehensive surveillance program.  Test performance has been validated by [facility] for patients greater than or equal to 53 year old. It is not intended to diagnose infection nor to guide or monitor treatment.   Culture, blood (single)     Status: None (Preliminary result)   Collection Time: 11/26/14 12:40 PM  Result Value Ref Range Status   Specimen Description BLOOD LEFT HAND  Final   Special Requests BOTTLES DRAWN AEROBIC ONLY  6CC  Final   Culture NO GROWTH 1 DAY  Final   Report Status PENDING  Incomplete  Anaerobic culture     Status: None (Preliminary result)   Collection Time: 11/26/14 12:56 PM  Result Value Ref Range Status   Specimen Description ABSCESS BUTTOCKS LEFT  Final   Special Requests PT ON ZOSYN/VANCOMYCIN  Final   Gram Stain   Final    NO WBC SEEN NO SQUAMOUS EPITHELIAL CELLS SEEN NO ORGANISMS SEEN Performed at [facility]    Culture   Final    NO ANAEROBES ISOLATED; CULTURE IN PROGRESS FOR 5 DAYS Performed at [facility]    Report Status PENDING  Incomplete  Culture, routine-abscess     Status: None (Preliminary result)   Collection Time: 11/26/14 12:56 PM  Result Value Ref Range Status   Specimen Description ABSCESS BUTTOCKS LEFT  Final   Special Requests PT ON ZOSYN/VANCOMYCIN  Final   Gram Stain   Final    NO WBC SEEN NO SQUAMOUS EPITHELIAL CELLS SEEN NO ORGANISMS SEEN Performed at [facility]    Culture   Final    RARE ESCHERICHIA COLI Performed at [facility]    Report Status PENDING  Incomplete     Labs: Basic Metabolic Panel:  Recent Labs Lab 11/25/14 2155 11/26/14 0438 11/27/14 0530 11/28/14 0455  NA 135 134* 135 137  K 4.2 4.0 3.9 4.0  CL 100* 100* 99* 101  CO2 26 26 26 27   GLUCOSE 128* 91 90 100*  BUN 12 10 7 8   CREATININE 0.93 0.91 0.87 0.80  CALCIUM 8.7* 8.3* 8.5* 8.4*    Liver Function Tests:  Recent Labs Lab 11/25/14 2155 11/26/14 0438  AST 44* 34  ALT 73* 66*  ALKPHOS 72 69  BILITOT 0.6 0.7  PROT 6.7 6.6  ALBUMIN 3.6 3.4*   No results for input(s): LIPASE, AMYLASE in the last 168 hours. No results for input(s): AMMONIA in the last 168 hours. CBC:  Recent Labs Lab 11/25/14 2155 11/26/14 0438 11/27/14 0530 11/28/14 0455  WBC 11.1* 9.5 8.2 5.9  NEUTROABS 8.3* 7.0  --   --   HGB 13.7 13.4 13.3 12.7*  HCT 39.8 39.5 39.2 37.9*  MCV 95.2 94.0 94.2 94.3  PLT 172 160 162 165   Cardiac Enzymes:  Recent Labs Lab 11/26/14 0438 11/26/14 1145 11/26/14 1836  TROPONINI <0.03 <0.03 <0.03   BNP: BNP (last 3 results) No results for input(s): BNP in the last 8760 hours.  ProBNP (last 3 results) No results for input(s): PROBNP in the last 8760 hours.  CBG:  Recent Labs Lab 11/26/14 0803  GLUCAP 105*       Signed:  Regalado, Belkys A  [facility] 11/28/2014, 3:36 PM

## 2014-11-29 ENCOUNTER — Telehealth: Payer: Self-pay

## 2014-11-29 LAB — CULTURE, ROUTINE-ABSCESS: GRAM STAIN: NONE SEEN

## 2014-11-29 NOTE — Telephone Encounter (Signed)
Received communication from Tomi Bamberger, RN CM who indicated patient needing hospital follow-up appointment at [facility]. Call placed to patient's listed home number as well as emergency contact number; unable to reach patient at either number. Voicemails left x2 requesting return call from patient.

## 2014-12-01 LAB — ANAEROBIC CULTURE: GRAM STAIN: NONE SEEN

## 2014-12-01 LAB — CULTURE, BLOOD (SINGLE): Culture: NO GROWTH

## 2014-12-05 ENCOUNTER — Inpatient Hospital Stay: Payer: Self-pay | Admitting: Internal Medicine

## 2014-12-18 ENCOUNTER — Emergency Department (HOSPITAL_COMMUNITY)
Admission: EM | Admit: 2014-12-18 | Discharge: 2014-12-18 | Disposition: A | Discharge status: Home / Self Care | Payer: Self-pay | Attending: Emergency Medicine | Admitting: Emergency Medicine

## 2014-12-18 DIAGNOSIS — M545 Low back pain: Secondary | ICD-10-CM | POA: Insufficient documentation

## 2014-12-18 DIAGNOSIS — M1990 Unspecified osteoarthritis, unspecified site: Secondary | ICD-10-CM | POA: Insufficient documentation

## 2014-12-18 DIAGNOSIS — K6289 Other specified diseases of anus and rectum: Secondary | ICD-10-CM | POA: Insufficient documentation

## 2014-12-18 DIAGNOSIS — G8918 Other acute postprocedural pain: Secondary | ICD-10-CM | POA: Insufficient documentation

## 2014-12-18 DIAGNOSIS — M62830 Muscle spasm of back: Secondary | ICD-10-CM | POA: Insufficient documentation

## 2014-12-18 DIAGNOSIS — Z720 Tobacco use: Secondary | ICD-10-CM | POA: Insufficient documentation

## 2014-12-18 LAB — URINALYSIS, ROUTINE W REFLEX MICROSCOPIC
Bilirubin Urine: NEGATIVE
GLUCOSE, UA: NEGATIVE mg/dL
Ketones, ur: NEGATIVE mg/dL
Leukocytes, UA: NEGATIVE
NITRITE: NEGATIVE
Protein, ur: NEGATIVE mg/dL
Specific Gravity, Urine: 1.026 (ref 1.005–1.030)
UROBILINOGEN UA: 0.2 mg/dL (ref 0.0–1.0)
pH: 5.5 (ref 5.0–8.0)

## 2014-12-18 LAB — CBC WITH DIFFERENTIAL/PLATELET
Basophils Absolute: 0 10*3/uL (ref 0.0–0.1)
Basophils Relative: 0 % (ref 0–1)
Eosinophils Absolute: 0.2 10*3/uL (ref 0.0–0.7)
Eosinophils Relative: 3 % (ref 0–5)
HEMATOCRIT: 40.4 % (ref 39.0–52.0)
Hemoglobin: 13.8 g/dL (ref 13.0–17.0)
LYMPHS ABS: 1.8 10*3/uL (ref 0.7–4.0)
LYMPHS PCT: 26 % (ref 12–46)
MCH: 31.9 pg (ref 26.0–34.0)
MCHC: 34.2 g/dL (ref 30.0–36.0)
MCV: 93.3 fL (ref 78.0–100.0)
MONOS PCT: 3 % (ref 3–12)
Monocytes Absolute: 0.2 10*3/uL (ref 0.1–1.0)
Neutro Abs: 4.5 10*3/uL (ref 1.7–7.7)
Neutrophils Relative %: 68 % (ref 43–77)
PLATELETS: 170 10*3/uL (ref 150–400)
RBC: 4.33 MIL/uL (ref 4.22–5.81)
RDW: 13 % (ref 11.5–15.5)
WBC: 6.8 10*3/uL (ref 4.0–10.5)

## 2014-12-18 LAB — BASIC METABOLIC PANEL
Anion gap: 8 (ref 5–15)
BUN: 17 mg/dL (ref 6–20)
CO2: 24 mmol/L (ref 22–32)
CREATININE: 0.88 mg/dL (ref 0.61–1.24)
Calcium: 9.1 mg/dL (ref 8.9–10.3)
Chloride: 107 mmol/L (ref 101–111)
GLUCOSE: 92 mg/dL (ref 65–99)
Potassium: 4.3 mmol/L (ref 3.5–5.1)
Sodium: 139 mmol/L (ref 135–145)

## 2014-12-18 LAB — URINE MICROSCOPIC-ADD ON

## 2014-12-18 MED ORDER — MORPHINE SULFATE 4 MG/ML IJ SOLN
4.0000 mg | Freq: Once | INTRAMUSCULAR | Status: AC
  Administered 2014-12-18: 4 mg via INTRAVENOUS
  Filled 2014-12-18: qty 1

## 2014-12-18 MED ORDER — POLYETHYLENE GLYCOL 3350 17 GM/SCOOP PO POWD: 17.0000 g | Freq: Two times a day (BID) | ORAL | Status: DC

## 2014-12-18 MED ORDER — METHOCARBAMOL 500 MG PO TABS: 500.0000 mg | ORAL_TABLET | Freq: Two times a day (BID) | ORAL | Status: DC | PRN

## 2014-12-18 MED ORDER — METHOCARBAMOL 500 MG PO TABS
500.0000 mg | ORAL_TABLET | Freq: Once | ORAL | Status: AC
  Administered 2014-12-18: 500 mg via ORAL
  Filled 2014-12-18: qty 1

## 2014-12-18 MED ORDER — ONDANSETRON HCL 4 MG/2ML IJ SOLN
4.0000 mg | INTRAMUSCULAR | Status: AC
  Administered 2014-12-18: 4 mg via INTRAVENOUS
  Filled 2014-12-18: qty 2

## 2014-12-18 NOTE — Discharge Instructions (Signed)
Please do Sitz baths at home 2-3 times per day. Please call and make an appointment for follow-up with your general surgeon Dr. Gershon Crane. Please do back exercises and use Naprosyn and Robaxin twice a day.   Back Exercises Back exercises help treat and prevent back injuries. The goal of back exercises is to increase the strength of your abdominal and back muscles and the flexibility of your back. These exercises should be started when you no longer have back pain. Back exercises include:  Pelvic Tilt. Lie on your back with your knees bent. Tilt your pelvis until the lower part of your back is against the floor. Hold this position 5 to 10 sec and repeat 5 to 10 times.  Knee to Chest. Pull first 1 knee up against your chest and hold for 20 to 30 seconds, repeat this with the other knee, and then both knees. This may be done with the other leg straight or bent, whichever feels better.  Sit-Ups or Curl-Ups. Bend your knees 90 degrees. Start with tilting your pelvis, and do a partial, slow sit-up, lifting your trunk only 30 to 45 degrees off the floor. Take at least 2 to 3 seconds for each sit-up. Do not do sit-ups with your knees out straight. If partial sit-ups are difficult, simply do the above but with only tightening your abdominal muscles and holding it as directed.  Hip-Lift. Lie on your back with your knees flexed 90 degrees. Push down with your feet and shoulders as you raise your hips a couple inches off the floor; hold for 10 seconds, repeat 5 to 10 times.  Back arches. Lie on your stomach, propping yourself up on bent elbows. Slowly press on your hands, causing an arch in your low back. Repeat 3 to 5 times. Any initial stiffness and discomfort should lessen with repetition over time.  Shoulder-Lifts. Lie face down with arms beside your body. Keep hips and torso pressed to floor as you slowly lift your head and shoulders off the floor. Do not overdo your exercises, especially in the beginning.  Exercises may cause you some mild back discomfort which lasts for a few minutes; however, if the pain is more severe, or lasts for more than 15 minutes, do not continue exercises until you see your caregiver. Improvement with exercise therapy for back problems is slow.  See your caregivers for assistance with developing a proper back exercise program. Document Released: 06/24/2004 Document Revised: 08/09/2011 Document Reviewed: 03/18/2011 [facility] Patient Information 2015 [place], [place]. This information is not intended to replace advice given to you by your health care provider. Make sure you discuss any questions you have with your health care provider. Back Pain, Adult Low back pain is very common. About 1 in 5 people have back pain.The cause of low back pain is rarely dangerous. The pain often gets better over time.About half of people with a sudden onset of back pain feel better in just 2 weeks. About 8 in 10 people feel better by 6 weeks.  CAUSES Some common causes of back pain include:  Strain of the muscles or ligaments supporting the spine.  Wear and tear (degeneration) of the spinal discs.  Arthritis.  Direct injury to the back. DIAGNOSIS Most of the time, the direct cause of low back pain is not known.However, back pain can be treated effectively even when the exact cause of the pain is unknown.Answering your caregiver's questions about your overall health and symptoms is one of the most accurate ways to  make sure the cause of your pain is not dangerous. If your caregiver needs more information, he or she may order lab work or imaging tests (X-rays or MRIs).However, even if imaging tests show changes in your back, this usually does not require surgery. HOME CARE INSTRUCTIONS For many people, back pain returns.Since low back pain is rarely dangerous, it is often a condition that people can learn to [facility] their own.   Remain active. It is stressful on the back to sit or stand  in one place. Do not sit, drive, or stand in one place for more than 30 minutes at a time. Take short walks on level surfaces as soon as pain allows.Try to increase the length of time you walk each day.  Do not stay in bed.Resting more than 1 or 2 days can delay your recovery.  Do not avoid exercise or work.Your body is made to move.It is not dangerous to be active, even though your back may hurt.Your back will likely heal faster if you return to being active before your pain is gone.  Pay attention to your body when you bend and lift. Many people have less discomfortwhen lifting if they bend their knees, keep the load close to their bodies,and avoid twisting. Often, the most comfortable positions are those that put less stress on your recovering back.  Find a comfortable position to sleep. Use a firm mattress and lie on your side with your knees slightly bent. If you lie on your back, put a pillow under your knees.  Only take over-the-counter or prescription medicines as directed by your caregiver. Over-the-counter medicines to reduce pain and inflammation are often the most helpful.Your caregiver may prescribe muscle relaxant drugs.These medicines help dull your pain so you can more quickly return to your normal activities and healthy exercise.  Put ice on the injured area.  Put ice in a plastic bag.  Place a towel between your skin and the bag.  Leave the ice on for 15-20 minutes, 03-04 times a day for the first 2 to 3 days. After that, ice and heat may be alternated to reduce pain and spasms.  Ask your caregiver about trying back exercises and gentle massage. This may be of some benefit.  Avoid feeling anxious or stressed.Stress increases muscle tension and can worsen back pain.It is important to recognize when you are anxious or stressed and learn ways to manage it.Exercise is a great option. SEEK MEDICAL CARE IF:  You have pain that is not relieved with rest or  medicine.  You have pain that does not improve in 1 week.  You have new symptoms.  You are generally not feeling well. SEEK IMMEDIATE MEDICAL CARE IF:   You have pain that radiates from your back into your legs.  You develop new bowel or bladder control problems.  You have unusual weakness or numbness in your arms or legs.  You develop nausea or vomiting.  You develop abdominal pain.  You feel faint. Document Released: 05/17/2005 Document Revised: 11/16/2011 Document Reviewed: 09/18/2013 [facility] Patient Information 2015 [place], [place]. This information is not intended to replace advice given to you by your health care provider. Make sure you discuss any questions you have with your health care provider. Abscess Care After An abscess (also called a boil or furuncle) is an infected area that contains a collection of pus. Signs and symptoms of an abscess include pain, tenderness, redness, or hardness, or you may feel a moveable soft area under your skin.  An abscess can occur anywhere in the body. The infection may spread to surrounding tissues causing cellulitis. A cut (incision) by the surgeon was made over your abscess and the pus was drained out. Gauze may have been packed into the space to provide a drain that will allow the cavity to heal from the inside outwards. The boil may be painful for 5 to 7 days. Most people with a boil do not have high fevers. Your abscess, if seen early, may not have localized, and may not have been lanced. If not, another appointment may be required for this if it does not get better on its own or with medications. HOME CARE INSTRUCTIONS   Only take over-the-counter or prescription medicines for pain, discomfort, or fever as directed by your caregiver.  When you bathe, soak and then remove gauze or iodoform packs at least daily or as directed by your caregiver. You may then wash the wound gently with mild soapy water. Repack with gauze or do as your  caregiver directs. SEEK IMMEDIATE MEDICAL CARE IF:   You develop increased pain, swelling, redness, drainage, or bleeding in the wound site.  You develop signs of generalized infection including muscle aches, chills, fever, or a general ill feeling.  An oral temperature above 102 F (38.9 C) develops, not controlled by medication. See your caregiver for a recheck if you develop any of the symptoms described above. If medications (antibiotics) were prescribed, take them as directed. Document Released: 12/03/2004 Document Revised: 08/09/2011 Document Reviewed: 07/31/2007 [facility] Patient Information 2015 [place], [place]. This information is not intended to replace advice given to you by your health care provider. Make sure you discuss any questions you have with your health care provider.

## 2014-12-18 NOTE — ED Notes (Signed)
Pt given urinal, asked to provide urine sample

## 2014-12-18 NOTE — ED Notes (Signed)
Pt has an abscess on buttocks. Pt had it lanced 3 weeks ago. Pt states it is continuing to drain/cause him to have sweats. Pt is diaphoretic. Pt also complaining of lower back pain

## 2014-12-18 NOTE — Progress Notes (Signed)
Patient ID: Jon Wade, male   DOB: March 20, 1971, 44 y.o.   MRN: [id number]    Subjective: Pt known to [place] from recent I&D of left buttock abscess by Dr. Georgette Dover on 11-26-14.  He went home with dressing changes and a penrose drain.  He was supposed to see Dr. Georgette Dover in follow up, but never did.  He presents to the [facility] today for evaluation for lower back pain and some rectal pain.  He states that his wound is still draining some and he is having pain with BMs.  He is constipated and does not take stool softeners.  No fevers.  Objective: Vital signs in last 24 hours: Temp:  [98.1 F (36.7 C)] 98.1 F (36.7 C) (07/20 1138) Pulse Rate:  [73-75] 75 (07/20 1300) Resp:  [15-16] 16 (07/20 1300) BP: (113-140)/(61-90) 113/61 mmHg (07/20 1300) SpO2:  [96 %-99 %] 96 % (07/20 1300) Weight:  [127.914 kg (282 lb)] 127.914 kg (282 lb) (07/20 1138)    Intake/Output from previous day:   Intake/Output this shift:    PE: Skin: buttock: wound is clean and healing very well.  No areas of fluctuance or erythema seen.  Patient is tender along the wound edges and at his rectum.  No external hemorrhoids are noted.  Lab Results:   Recent Labs  12/18/14 1228  WBC 6.8  HGB 13.8  HCT 40.4  PLT 170   BMET  Recent Labs  12/18/14 1228  NA 139  K 4.3  CL 107  CO2 24  GLUCOSE 92  BUN 17  CREATININE 0.88  CALCIUM 9.1   PT/INR No results for input(s): LABPROT, INR in the last 72 hours. CMP     Component Value Date/Time   NA 139 12/18/2014 1228   K 4.3 12/18/2014 1228   CL 107 12/18/2014 1228   CO2 24 12/18/2014 1228   GLUCOSE 92 12/18/2014 1228   BUN 17 12/18/2014 1228   CREATININE 0.88 12/18/2014 1228   CALCIUM 9.1 12/18/2014 1228   PROT 6.6 11/26/2014 0438   ALBUMIN 3.4* 11/26/2014 0438   AST 34 11/26/2014 0438   ALT 66* 11/26/2014 0438   ALKPHOS 69 11/26/2014 0438   BILITOT 0.7 11/26/2014 0438   GFRNONAA >60 12/18/2014 1228   GFRAA >60 12/18/2014 1228   Lipase  No results found for:  LIPASE     Studies/Results: No results found.  Anti-infectives: Anti-infectives    None       Assessment/Plan  1. S/p I&D of perirectal/left buttock abscess -wound is clean and healing well.  No obvious signs of recurrent infection.  He is afebrile and his WBC is normal.   -It is possible he may have a small fissure, clinically, given his history.  I have encouraged him to continue sitz bathes at home 2-3 times a day and to get on a stool softener such as miralax and take it daily and not just prn. -I have encouraged him to follow up with Dr. Georgette Dover in our office in 2-3 weeks for recheck of his wound and follow up of his rectal pain.      Jeremie Abdelaziz E 12/18/2014, 1:18 PM Pager: [phone]

## 2014-12-18 NOTE — ED Provider Notes (Signed)
CSN: [id number]     Arrival date & time 12/18/14  1135 History   First MD Initiated Contact with Patient 12/18/14 1149     Chief Complaint  Patient presents with  . Abscess   Jon Wade is a 44 y.o. male with a history of left buttocks abscess with I&D by general surgery and ETOH use who presents to the ED complaining of subjective fevers, increasing pain and drainage from his left buttocks and right sided low back pain for the past 2-3 days. Patient currently complains of 7 out of 10 right-sided low back pain that he describes a back spasm. The patient had an incision and drainage of a left buttocks abscess by Dr. Georgette Dover on 11/26/2014. The patient reports he been doing better until the past 2-3 days he noticed increasing drainage and purulent drainage from his buttocks abscess. Patient denies history of back injury. Patient reports his back pain is worse with movement. Patient reports taking naproxen this morning with some relief. Patient reports he uses alcohol daily and has been cutting his alcohol use in half since his last admission. He reports he is been drinking only 2 beers a day. He reports he last drink yesterday. He denies history of seizures from withdrawal from alcohol. The patient denies urinary symptoms, loss of bowel control, loss of bladder control, abdominal pain, nausea, vomiting, diarrhea, chest pain, shortness of breath, numbness, tingling, weakness.     (Consider location/radiation/quality/duration/timing/severity/associated sxs/prior Treatment) HPI  Past Medical History  Diagnosis Date  . Arthritis     "hands maybe" (11/26/2014)   Past Surgical History  Procedure Laterality Date  . Knee arthroscopy Right 1988/1989  . Incision and drainage abscess Left 11/26/2014    buttocks  . Irrigation and debridement buttocks Left 11/26/2014    Procedure: Incision and Drainage of Left Buttock Abscess;  Surgeon: Donnie Mesa, MD;  [facility]: [facility] OR;  Service: General;  Laterality: Left;    Family History  Problem Relation Age of Onset  . Diabetes Mellitus II Father    History  Substance Use Topics  . Smoking status: Current Every Day Smoker -- 2.00 packs/day for 27 years    Types: Cigarettes  . Smokeless tobacco: Never Used  . Alcohol Use: 54.0 oz/week    90 Cans of beer per week     Comment: 11/26/2014 "4, 40oz beers most day; I drink q day"    Review of Systems  Constitutional: Negative for fever and chills.  HENT: Negative for congestion and sore throat.   Eyes: Negative for visual disturbance.  Respiratory: Negative for cough, shortness of breath and wheezing.   Cardiovascular: Negative for chest pain and palpitations.  Gastrointestinal: Positive for rectal pain. Negative for nausea, vomiting, abdominal pain, diarrhea and blood in stool.  Genitourinary: Negative for dysuria, urgency, frequency, hematuria, discharge, penile swelling, scrotal swelling, difficulty urinating, penile pain and testicular pain.  Musculoskeletal: Positive for back pain. Negative for joint swelling, gait problem and neck pain.  Skin: Positive for color change and wound. Negative for pallor and rash.  Neurological: Negative for dizziness, weakness, light-headedness, numbness and headaches.      Allergies  Review of patient's allergies indicates no known allergies.  Home Medications   Prior to Admission medications   Medication Sig Start Date End Date Taking? Authorizing Provider  naproxen (NAPROSYN) 500 MG tablet Take 500 mg by mouth once as needed for mild pain.   Yes Historical Provider, MD  ciprofloxacin (CIPRO) 500 MG tablet Take 1 tablet (500 mg  total) by mouth 2 (two) times daily. Patient not taking: Reported on 12/18/2014 11/28/14   Belkys A Regalado, MD  methocarbamol (ROBAXIN) 500 MG tablet Take 1 tablet (500 mg total) by mouth 2 (two) times daily as needed for muscle spasms. 12/18/14   Waynetta Pean, PA-C  oxyCODONE-acetaminophen (PERCOCET/ROXICET) 5-325 MG per tablet Take  1-2 tablets by mouth every 4 (four) hours as needed for moderate pain. Patient not taking: Reported on 12/18/2014 11/28/14   Belkys A Regalado, MD  polyethylene glycol powder (GLYCOLAX/MIRALAX) powder Take 17 g by mouth 2 (two) times daily. Until daily soft stools  OTC 12/18/14   Waynetta Pean, PA-C   BP 126/82 mmHg  Pulse 72  Temp(Src) 98.1 F (36.7 C) (Oral)  Resp 16  Ht 5\' 9"  (1.753 m)  Wt 282 lb (127.914 kg)  BMI 41.63 kg/m2  SpO2 96% Physical Exam  Constitutional: He appears well-developed and well-nourished. No distress.  Nontoxic appearing.  HENT:  Head: Normocephalic and atraumatic.  Right Ear: External ear normal.  Left Ear: External ear normal.  Mouth/Throat: Oropharynx is clear and moist.  Eyes: Conjunctivae are normal. Pupils are equal, round, and reactive to light. Right eye exhibits no discharge. Left eye exhibits no discharge.  Neck: Normal range of motion. Neck supple. No JVD present.  Cardiovascular: Normal rate, regular rhythm, normal heart sounds and intact distal pulses.  Exam reveals no gallop and no friction rub.   No murmur heard. Pulmonary/Chest: Effort normal and breath sounds normal. No respiratory distress. He has no wheezes. He has no rales.  Abdominal: Soft. He exhibits no distension. There is no tenderness. There is no guarding.  Abdomen is soft and nontender to palpation.  Genitourinary:  To the patient's left buttocks there is a wound from previous surgical incision. It appears to be healing well. It is clean and is no drainage noted. There is some soft tissue tenderness around the edges of the wound. No area of fluctuance, erythema or induration noted.   Digital rectal exam performed by me with male nurse tech chaperone. The patient's anal sphincter tone is intact. Patient does seem to have an area of fullness internally to his left. Patient reports feeling very tender. No prostate tenderness or bogginess appreciated. Scrotal contents is normal. No  testicular penile tenderness to palpation. No tenderness in his perineum.   Musculoskeletal: Normal range of motion. He exhibits tenderness. He exhibits no edema.  No midline back or neck tenderness. Patient has mild right lateral lumbar low back tenderness to palpation. No back edema, deformity, erythema or ecchymosis noted. The patient is able to ambulate without difficulty or assistance. Patient's strength is 5 out of 5 in his bilateral lower extremities.  Lymphadenopathy:    He has no cervical adenopathy.  Neurological: He is alert. He has normal reflexes. He displays normal reflexes. Coordination normal.  Sensation is intact to his bilateral lower extremities. Bilateral patellar DTRs are intact.  Skin: Skin is warm and dry. No rash noted. He is not diaphoretic. There is erythema. No pallor.  To the patient's left buttocks there is an area of ulceration from previous surgical incision. There is no drainage noted. The area appears clean. There is some soft tissue tenderness but very mild induration localized to the area.  Psychiatric: He has a normal mood and affect. His behavior is normal.  Nursing note and vitals reviewed.   ED Course  Procedures (including critical care time) Labs Review Labs Reviewed  URINALYSIS, ROUTINE W REFLEX MICROSCOPIC (NOT AT  [facility]) - Abnormal; Notable for the following:    Hgb urine dipstick SMALL (*)    All other components within normal limits  BASIC METABOLIC PANEL  CBC WITH DIFFERENTIAL/PLATELET  URINE MICROSCOPIC-ADD ON    Imaging Review No results found.   EKG Interpretation None      Filed Vitals:   12/18/14 1200 12/18/14 1300 12/18/14 1330 12/18/14 1445  BP: 135/90 113/61 122/78 126/82  Pulse: 73 75 69 72  Temp:      TempSrc:      Resp: 16 16 16 16   Height:      Weight:      SpO2: 99% 96% 96% 96%     MDM   Meds given in ED:  Medications  morphine 4 MG/ML injection 4 mg (4 mg Intravenous Given 12/18/14 1301)  ondansetron (ZOFRAN)  injection 4 mg (4 mg Intravenous Given 12/18/14 1301)  methocarbamol (ROBAXIN) tablet 500 mg (500 mg Oral Given 12/18/14 1343)    New Prescriptions   METHOCARBAMOL (ROBAXIN) 500 MG TABLET    Take 1 tablet (500 mg total) by mouth 2 (two) times daily as needed for muscle spasms.   POLYETHYLENE GLYCOL POWDER (GLYCOLAX/MIRALAX) POWDER    Take 17 g by mouth 2 (two) times daily. Until daily soft stools  OTC    Final diagnoses:  Right low back pain, with sciatica presence unspecified  Back muscle spasm  Post-operative pain   This  is a 44 y.o. male with a history of left buttocks abscess with I&D by general surgery and ETOH use who presents to the ED complaining of subjective fevers, increasing pain and drainage from his left buttocks and right sided low back pain for the past 2-3 days. Patient currently complains of 7 out of 10 right-sided low back pain that he describes a back spasm. The patient had an incision and drainage of a left buttocks abscess by Dr. Georgette Dover on 11/26/2014. The patient reports he been doing better until the past 2-3 days he noticed increasing drainage and purulent drainage from his buttocks abscess. Patient denies history of back injury. His back pain is worse with movement.  He has right low back pain in his site of his previous abscesses on his left butt cheek. I question whether this back pain is related due to his position of sitting due to pain while sitting on his bottom. On exam the patient is afebrile and nontoxic-appearing. No neurological deficits and normal neuro exam.  Patient can walk without difficulty or assistance..  No loss of bowel or bladder control.  No concern for cauda equina.  There is no midline neck or back tenderness. No back erythema, deformity or ecchymosis noted. On GU exam the patient has well-healing wound from his previous surgical incision on his left buttocks. The wound is clean and dry. Patient is tender along the edges of the wound. There are no areas  of erythema, edema or induration. On digital rectal exam the patient is tender and has a mild area of fullness to his left side. No discharge noted. Anal sphincter tone is intact. Patient is given morphine and Robaxin in the ED. The patient was also evaluated by surgical PA Saverio Danker. She examined his wound site and feels he should start using sitz baths and follow up with Dr. Gershon Crane in office. She feels his site is healing well. I agree.  Physical urinalysis is unremarkable. BMP and CBC are within normal limits. No leukocytosis. At reevaluation the patient reports feeling much better  after pain medicine Robaxin. We'll discharge a prescription for MiraLAX and Robaxin. Patient reports he has plenty of naproxen at home. Education on back exercises provided. I advised patient to follow-up with Dr. Gershon Crane this week. I advised the patient to follow-up with their primary care provider this week. I advised the patient to return to the emergency department with new or worsening symptoms or new concerns. The patient verbalized understanding and agreement with plan.    This patient was discussed with Dr. Christy Gentles who agrees with assessment and plan.   Waynetta Pean, PA-C 12/18/14 1527  Ripley Fraise, MD 12/18/14 [phone]

## 2014-12-18 NOTE — ED Notes (Signed)
Pt verbalizes understanding of d/c instructions and denies any further needs at this time. 

## 2014-12-30 ENCOUNTER — Encounter (HOSPITAL_COMMUNITY): Payer: Self-pay | Admitting: *Deleted

## 2014-12-30 ENCOUNTER — Emergency Department (HOSPITAL_COMMUNITY)
Admission: EM | Admit: 2014-12-30 | Discharge: 2014-12-31 | Disposition: A | Discharge status: Home / Self Care | Payer: Self-pay | Attending: Emergency Medicine | Admitting: Emergency Medicine

## 2014-12-30 ENCOUNTER — Emergency Department (HOSPITAL_COMMUNITY): Payer: Self-pay

## 2014-12-30 DIAGNOSIS — Y9289 Other specified places as the place of occurrence of the external cause: Secondary | ICD-10-CM | POA: Insufficient documentation

## 2014-12-30 DIAGNOSIS — K625 Hemorrhage of anus and rectum: Secondary | ICD-10-CM | POA: Insufficient documentation

## 2014-12-30 DIAGNOSIS — Z720 Tobacco use: Secondary | ICD-10-CM | POA: Insufficient documentation

## 2014-12-30 DIAGNOSIS — M1990 Unspecified osteoarthritis, unspecified site: Secondary | ICD-10-CM | POA: Insufficient documentation

## 2014-12-30 DIAGNOSIS — Y9389 Activity, other specified: Secondary | ICD-10-CM | POA: Insufficient documentation

## 2014-12-30 DIAGNOSIS — S63601A Unspecified sprain of right thumb, initial encounter: Secondary | ICD-10-CM | POA: Insufficient documentation

## 2014-12-30 DIAGNOSIS — X58XXXA Exposure to other specified factors, initial encounter: Secondary | ICD-10-CM | POA: Insufficient documentation

## 2014-12-30 DIAGNOSIS — Y990 Civilian activity done for income or pay: Secondary | ICD-10-CM | POA: Insufficient documentation

## 2014-12-30 LAB — COMPREHENSIVE METABOLIC PANEL
ALT: 72 U/L — ABNORMAL HIGH (ref 17–63)
AST: 41 U/L (ref 15–41)
Albumin: 4.3 g/dL (ref 3.5–5.0)
Alkaline Phosphatase: 64 U/L (ref 38–126)
Anion gap: 11 (ref 5–15)
BUN: 12 mg/dL (ref 6–20)
CO2: 25 mmol/L (ref 22–32)
Calcium: 9.3 mg/dL (ref 8.9–10.3)
Chloride: 101 mmol/L (ref 101–111)
Creatinine, Ser: 0.94 mg/dL (ref 0.61–1.24)
GFR calc Af Amer: >60 mL/min (ref >60)
GFR calc non Af Amer: >60 mL/min (ref >60)
Glucose, Bld: 72 mg/dL (ref 65–99)
Potassium: 3.7 mmol/L (ref 3.5–5.1)
Sodium: 137 mmol/L (ref 135–145)
Total Bilirubin: 0.7 mg/dL (ref 0.3–1.2)
Total Protein: 8 g/dL (ref 6.5–8.1)

## 2014-12-30 LAB — CBC
HCT: 40.8 % (ref 39.0–52.0)
Hemoglobin: 14.2 g/dL (ref 13.0–17.0)
MCH: 32.6 pg (ref 26.0–34.0)
MCHC: 34.8 g/dL (ref 30.0–36.0)
MCV: 93.6 fL (ref 78.0–100.0)
Platelets: 190 10*3/uL (ref 150–400)
RBC: 4.36 MIL/uL (ref 4.22–5.81)
RDW: 13 % (ref 11.5–15.5)
WBC: 10.5 10*3/uL (ref 4.0–10.5)

## 2014-12-30 LAB — TYPE AND SCREEN
ABO/RH(D): A POS
Antibody Screen: NEGATIVE

## 2014-12-30 MED ORDER — OXYCODONE-ACETAMINOPHEN 5-325 MG PO TABS: 1.0000 | ORAL_TABLET | ORAL | Status: DC | PRN

## 2014-12-30 NOTE — ED Notes (Signed)
Pt states that he had been experiencing bloody stools x 4 months. States that he was seen here at the end of June for he same and prescribed stool softeners and given a referral to see a GI dr for a colonoscopy. States that he does not have insurance so he was not able to see a GI dr. Quintella Baton that he did use the stool softener when needed and his stools got better. However, last week he started experiencing diarrhea with dark red blood and pieces of what appeared to be flesh. Pt states that it is increasingly worse. Pt states that he was at work today and his right hand thumb got pulled back and he is now experiencing pain through the thumb and wrist. Thumb appears red and swollen and is hot to touch.

## 2014-12-30 NOTE — Discharge Instructions (Signed)
Bloody Stools Bloody stools often mean that there is a problem in the digestive tract. Your caregiver may use the term "melena" to describe black, tarry, and bad smelling stools or "hematochezia" to describe red or maroon-colored stools. Blood seen in the stool can be caused by bleeding anywhere along the intestinal tract.  A black stool usually means that blood is coming from the upper part of the gastrointestinal tract (esophagus, stomach, or small bowel). Passing maroon-colored stools or bright red blood usually means that blood is coming from lower down in the large bowel or the rectum. However, sometimes massive bleeding in the stomach or small intestine can cause bright red bloody stools.  Consuming black licorice, lead, iron pills, medicines containing bismuth subsalicylate, or blueberries can also cause black stools. Your caregiver can test black stools to see if blood is present. It is important that the cause of the bleeding be found. Treatment can then be started, and the problem can be corrected. Rectal bleeding may not be serious, but you should not assume everything is okay until you know the cause.It is very important to follow up with your caregiver or a specialist in gastrointestinal problems. CAUSES  Blood in the stools can come from various underlying causes.Often, the cause is not found during your first visit. Testing is often needed to discover the cause of bleeding in the gastrointestinal tract. Causes range from simple to serious or even life-threatening.Possible causes include:  Hemorrhoids.These are veins that are full of blood (engorged) in the rectum. They cause pain, inflammation, and may bleed.  Anal fissures.These are areas of painful tearing which may bleed. They are often caused by passing hard stool.  Diverticulosis.These are pouches that form on the colon over time, with age, and may bleed significantly.  Diverticulitis.This is inflammation in areas with  diverticulosis. It can cause pain, fever, and bloody stools, although bleeding is rare.  Proctitis and colitis. These are inflamed areas of the rectum or colon. They may cause pain, fever, and bloody stools.  Polyps and cancer. Colon cancer is a leading cause of preventable cancer death.It often starts out as precancerous polyps that can be removed during a colonoscopy, preventing progression into cancer. Sometimes, polyps and cancer may cause rectal bleeding.  Gastritis and ulcers.Bleeding from the upper gastrointestinal tract (near the stomach) may travel through the intestines and produce black, sometimes tarry, often bad smelling stools. In certain cases, if the bleeding is fast enough, the stools may not be black, but red and the condition may be life-threatening. SYMPTOMS  You may have stools that are bright red and bloody, that are normal color with blood on them, or that are dark black and tarry. In some cases, you may only have blood in the toilet bowl. Any of these cases need medical care. You may also have:  Pain at the anus or anywhere in the rectum.  Lightheadedness or feeling faint.  Extreme weakness.  Nausea or vomiting.  Fever. DIAGNOSIS Your caregiver may use the following methods to find the cause of your bleeding:  Taking a medical history. Age is important. Older people tend to develop polyps and cancer more often. If there is anal pain and a hard, large stool associated with bleeding, a tear of the anus may be the cause. If blood drips into the toilet after a bowel movement, bleeding hemorrhoids may be the problem. The color and frequency of the bleeding are additional considerations. In most cases, the medical history provides clues, but seldom the final  answer.  A visual and finger (digital) exam. Your caregiver will inspect the anal area, looking for tears and hemorrhoids. A finger exam can provide information when there is tenderness or a growth inside. In men, the  prostate is also examined.  Endoscopy. Several types of small, long scopes (endoscopes) are used to view the colon.  In the office, your caregiver may use a rigid, or more commonly, a flexible viewing sigmoidoscope. This exam is called flexible sigmoidoscopy. It is performed in 5 to 10 minutes.  A more thorough exam is accomplished with a colonoscope. It allows your caregiver to view the entire 5 to 6 foot long colon. Medicine to help you relax (sedative) is usually given for this exam. Frequently, a bleeding lesion may be present beyond the reach of the sigmoidoscope. So, a colonoscopy may be the best exam to start with. Both exams are usually done on an outpatient basis. This means the patient does not stay overnight in the hospital or surgery center.  An upper endoscopy may be needed to examine your stomach. Sedation is used and a flexible endoscope is put in your mouth, down to your stomach.  A barium enema X-ray. This is an X-ray exam. It uses liquid barium inserted by enema into the rectum. This test alone may not identify an actual bleeding point. X-rays highlight abnormal shadows, such as those made by lumps (tumors), diverticuli, or colitis. TREATMENT  Treatment depends on the cause of your bleeding.   For bleeding from the stomach or colon, the caregiver doing your endoscopy or colonoscopy may be able to stop the bleeding as part of the procedure.  Inflammation or infection of the colon can be treated with medicines.  Many rectal problems can be treated with creams, suppositories, or warm baths.  Surgery is sometimes needed.  Blood transfusions are sometimes needed if you have lost a lot of blood.  For any bleeding problem, let your caregiver know if you take aspirin or other blood thinners regularly. HOME CARE INSTRUCTIONS   Take any medicines exactly as prescribed.  Keep your stools soft by eating a diet high in fiber. Prunes (1 to 3 a day) work well for many people.  Drink  enough water and fluids to keep your urine clear or pale yellow.  Take sitz baths if advised. A sitz bath is when you sit in a bathtub with warm water for 10 to 15 minutes to soak, soothe, and cleanse the rectal area.  If enemas or suppositories are advised, be sure you know how to use them. Tell your caregiver if you have problems with this.  Monitor your bowel movements to look for signs of improvement or worsening. SEEK MEDICAL CARE IF:   You do not improve in the time expected.  Your condition worsens after initial improvement.  You develop any new symptoms. SEEK IMMEDIATE MEDICAL CARE IF:   You develop severe or prolonged rectal bleeding.  You vomit blood.  You feel weak or faint.  You have a fever. MAKE SURE YOU:  Understand these instructions.  Will watch your condition.  Will get help right away if you are not doing well or get worse. Document Released: 05/07/2002 Document Revised: 08/09/2011 Document Reviewed: 10/02/2010 [facility] Patient Information 2015 [place], [place]. This information is not intended to replace advice given to you by your health care provider. Make sure you discuss any questions you have with your health care provider.  Gamekeeper's, Skier's Thumb You have injured some ligaments in your thumb. This  can happen suddenly or over a long period of time. It may be difficult for you to hold things by pinching them between your thumb and finger. The injury may take 6 to 8 weeks to heal. Your injury is a strain injury and not a complete tear so it may be treated with a cast. A complete tear of the ligament can lead to a loss of function of the thumb if not fixed surgically. It got its name from when gamekeepers used to kill game (hunted or trapped animals) by pinning them down around the neck between their thumb and index finger.  HOME CARE INSTRUCTIONS   Apply ice to the injury for 15-20 minutes, 03-04 times per day for the first 2 days. Put the ice in a  plastic bag and place a towel between the bag of ice and your skin.  Avoid using your thumb for as long as directed by your caregiver if it is not splinted or in a cast.  If your hand has been casted or splinted while your thumb heals, follow these instructions:  Plaster or fiberglass cast:  Do not try to scratch the skin under the cast using a sharp or pointed object.  Check the skin around the cast every day. You may put lotion on any red or sore areas.  Keep your cast dry. Your cast can be protected during bathing with a plastic bag. Do not put your cast into the water.  If your fiberglass cast gets wet, it can be gently dried using a hair dryer. Be careful not burn yourself.  Plaster splint:  Wear the splint for as long as directed by your caregiver or until you are seen for a follow-up examination.  Do not get your splint wet. Protect it during bathing with a plastic bag.  You may loosen the elastic bandage around the splint if your fingers start to get numb, tingle, get cold, or turn blue.  Do not put pressure on your cast or splint; this may cause it to break. Do not lean on hard surfaces for 24 hours after application.  Only take over-the-counter or prescription medicines for pain, discomfort, or fever as directed by your caregiver.  IMPORTANT: follow up with your caregiver or keep or call for any appointments with specialists as directed. The failure to follow up could result in chronic pain and / or disability. SEEK IMMEDIATE MEDICAL CARE IF:  Your thumb or fingers change color, become painful, or there is numbness or tingling in your thumb or fingers. Your cast or splint may be too tight. MAKE SURE YOU:   Understand these instructions.  Will watch your condition.  Will get help right away if you are not doing well or get worse. Document Released: 05/17/2005 Document Revised: 08/09/2011 Document Reviewed: 01/04/2008 [facility] Patient Information 2015 [place], [place].  This information is not intended to replace advice given to you by your health care provider. Make sure you discuss any questions you have with your health care provider.

## 2014-12-30 NOTE — ED Notes (Signed)
Ortho at bedside to apply splint to pt.

## 2014-12-30 NOTE — ED Notes (Signed)
Pt called out requesting this RNs presence.  When this RN went in to the room pt seemed agitated (although not stating that he is).  Pt had removed himself from the monitor and was requesting his splint and other orders to be completed.  This RN informed pt she was unaware of orders but would look and be back.    Confirmed with MD appropriate orders in place.  Verbal order to DC fecal occult card, as well as IV.   Pt to be sent home with GI collection kit and requisition in case pt able to collect and return to lab.

## 2014-12-30 NOTE — ED Notes (Signed)
Pt taken to xray 

## 2014-12-31 NOTE — ED Notes (Signed)
Pt able to ambulate in room.  Pt given information on [facility] for follow-up assistance without insurance.  Pt also provided with GI Pathogen Kit and given requisition to return to our lab as able to collect.

## 2015-01-02 LAB — GI PATHOGEN PANEL BY PCR, STOOL
C difficile toxin A/B: NOT DETECTED
Campylobacter by PCR: NOT DETECTED
Cryptosporidium by PCR: NOT DETECTED
E coli (ETEC) LT/ST: NOT DETECTED
E coli (STEC): NOT DETECTED
E coli 0157 by PCR: NOT DETECTED
G lamblia by PCR: NOT DETECTED
Norovirus GI/GII: NOT DETECTED
Rotavirus A by PCR: NOT DETECTED
Salmonella by PCR: NOT DETECTED
Shigella by PCR: NOT DETECTED

## 2015-01-04 NOTE — ED Provider Notes (Signed)
CSN: [id number]     Arrival date & time 12/30/14  2029 History   First MD Initiated Contact with Patient 12/30/14 2141     Chief Complaint  Patient presents with  . Blood In Stools  . Finger Injury     (Consider location/radiation/quality/duration/timing/severity/associated sxs/prior Treatment) HPI   44yM with R thumb pain. Was using shovel at work when got caught on something and handle hyperextended thumb. Pain/swelling since then. Extends up radial aspect of wrist with movement. NO numbness/tingling. Also concerned about ongoing rectal bleeding. Dark red blood with what looks like tissue occasionally. Usually mixed with stool but sometimes only seems to pass only or mostly blood. Frequent loose stools. 4-6 day. No blood thinners. crampy abdominal pain. Some rectal pain which feels different than pain was having with recent I&D of buttock abscess.   Past Medical History  Diagnosis Date  . Arthritis     "hands maybe" (11/26/2014)   Past Surgical History  Procedure Laterality Date  . Knee arthroscopy Right 1988/1989  . Incision and drainage abscess Left 11/26/2014    buttocks  . Irrigation and debridement buttocks Left 11/26/2014    Procedure: Incision and Drainage of Left Buttock Abscess;  Surgeon: Donnie Mesa, MD;  Location: [facility] OR;  Service: General;  Laterality: Left;   Family History  Problem Relation Age of Onset  . Diabetes Mellitus II Father    History  Substance Use Topics  . Smoking status: Current Every Day Smoker -- 2.00 packs/day for 27 years    Types: Cigarettes  . Smokeless tobacco: Never Used  . Alcohol Use: 54.0 oz/week    90 Cans of beer per week     Comment: 11/26/2014 "4, 40oz beers most day; I drink q day"    Review of Systems  All systems reviewed and negative, other than as noted in HPI.   Allergies  Review of patient's allergies indicates no known allergies.  Home Medications   Prior to Admission medications   Medication Sig Start Date End Date  Taking? Authorizing Provider  naproxen (NAPROSYN) 500 MG tablet Take 500 mg by mouth once as needed for mild pain.   Yes Historical Provider, MD  polyethylene glycol (MIRALAX / GLYCOLAX) packet Take 17 g by mouth daily as needed for mild constipation.   Yes Historical Provider, MD  ciprofloxacin (CIPRO) 500 MG tablet Take 1 tablet (500 mg total) by mouth 2 (two) times daily. Patient not taking: Reported on 12/18/2014 11/28/14   Belkys A Regalado, MD  methocarbamol (ROBAXIN) 500 MG tablet Take 1 tablet (500 mg total) by mouth 2 (two) times daily as needed for muscle spasms. Patient not taking: Reported on 12/30/2014 12/18/14   Waynetta Pean, PA-C  oxyCODONE-acetaminophen (PERCOCET/ROXICET) 5-325 MG per tablet Take 1-2 tablets by mouth every 4 (four) hours as needed for moderate pain. Patient not taking: Reported on 12/18/2014 11/28/14   Belkys A Regalado, MD  oxyCODONE-acetaminophen (PERCOCET/ROXICET) 5-325 MG per tablet Take 1-2 tablets by mouth every 4 (four) hours as needed for severe pain. 12/30/14   Virgel Manifold, MD  polyethylene glycol powder (GLYCOLAX/MIRALAX) powder Take 17 g by mouth 2 (two) times daily. Until daily soft stools  OTC Patient not taking: Reported on 12/30/2014 12/18/14   Waynetta Pean, PA-C   BP 143/86 mmHg  Pulse 84  Temp(Src) 99.4 F (37.4 C) (Oral)  Resp 18  SpO2 94% Physical Exam  Constitutional: He appears well-developed and well-nourished. No distress.  HENT:  Head: Normocephalic and atraumatic.  Eyes: Conjunctivae  are normal. Right eye exhibits no discharge. Left eye exhibits no discharge.  Neck: Neck supple.  Cardiovascular: Normal rate, regular rhythm and normal heart sounds.  Exam reveals no gallop and no friction rub.   No murmur heard. Pulmonary/Chest: Effort normal and breath sounds normal. No respiratory distress.  Abdominal: Soft. He exhibits no distension. There is no tenderness.  Musculoskeletal: He exhibits edema and tenderness.  Tenderness/swelling R  thumb, worse near MCP. No laxity appreciated. Closed injury. NVI. Can fully range although with increased pain particularly with extension.   Neurological: He is alert.  Skin: Skin is warm and dry.  Psychiatric: He has a normal mood and affect. His behavior is normal. Thought content normal.  Nursing note and vitals reviewed.   ED Course  Procedures (including critical care time) Labs Review Labs Reviewed  COMPREHENSIVE METABOLIC PANEL - Abnormal; Notable for the following:    ALT 72 (*)    All other components within normal limits  CBC  GI PATHOGEN PANEL BY PCR, STOOL  TYPE AND SCREEN    Imaging Review No results found.   EKG Interpretation None      MDM   Final diagnoses:  Rectal bleeding  Thumb sprain, right, initial encounter   44yM with thumb pain. No ligamentous laxity appreciated. No fx. Splint for comfort. Hand FU as needed for persistent symptoms. Rectal bleeding. I do not feel related to recent I&D of gluteal abscess. This appears to be healing w/o complication. No gross blood on exam. Brown appearing stool but heme +. H/H normal. No blood thinners. Pt concerned about ongoing rectal bleeding, but no need for hospitalization at this time or other emergent intervention. Needs GI FU. May need cleared by general surgery given recent I&D of abscess before can proceed with possible colonoscopy.     Virgel Manifold, MD 01/04/15 [phone]

## 2015-07-29 ENCOUNTER — Encounter (HOSPITAL_COMMUNITY): Payer: Self-pay | Admitting: Emergency Medicine

## 2015-07-29 ENCOUNTER — Emergency Department (HOSPITAL_COMMUNITY)
Admission: EM | Admit: 2015-07-29 | Discharge: 2015-07-30 | Disposition: A | Discharge status: Home / Self Care | Payer: Self-pay | Attending: Emergency Medicine | Admitting: Emergency Medicine

## 2015-07-29 ENCOUNTER — Emergency Department (HOSPITAL_COMMUNITY): Payer: Self-pay

## 2015-07-29 DIAGNOSIS — R6883 Chills (without fever): Secondary | ICD-10-CM | POA: Insufficient documentation

## 2015-07-29 DIAGNOSIS — R05 Cough: Secondary | ICD-10-CM | POA: Insufficient documentation

## 2015-07-29 DIAGNOSIS — K6289 Other specified diseases of anus and rectum: Secondary | ICD-10-CM | POA: Insufficient documentation

## 2015-07-29 DIAGNOSIS — M1990 Unspecified osteoarthritis, unspecified site: Secondary | ICD-10-CM | POA: Insufficient documentation

## 2015-07-29 DIAGNOSIS — F17210 Nicotine dependence, cigarettes, uncomplicated: Secondary | ICD-10-CM | POA: Insufficient documentation

## 2015-07-29 LAB — I-STAT CHEM 8, ED
BUN: 19 mg/dL (ref 6–20)
CALCIUM ION: 1.17 mmol/L (ref 1.12–1.23)
Chloride: 101 mmol/L (ref 101–111)
Creatinine, Ser: 1.2 mg/dL (ref 0.61–1.24)
GLUCOSE: 77 mg/dL (ref 65–99)
HCT: 53 % — ABNORMAL HIGH (ref 39.0–52.0)
HEMOGLOBIN: 18 g/dL — AB (ref 13.0–17.0)
Potassium: 4.2 mmol/L (ref 3.5–5.1)
Sodium: 142 mmol/L (ref 135–145)
TCO2: 28 mmol/L (ref 0–100)

## 2015-07-29 MED ORDER — CIPROFLOXACIN HCL 500 MG PO TABS: 500.0000 mg | ORAL_TABLET | Freq: Two times a day (BID) | ORAL | Status: DC

## 2015-07-29 MED ORDER — OXYCODONE-ACETAMINOPHEN 5-325 MG PO TABS: 1.0000 | ORAL_TABLET | ORAL | Status: DC | PRN

## 2015-07-29 MED ORDER — SODIUM CHLORIDE 0.9 % IV BOLUS (SEPSIS)
1000.0000 mL | Freq: Once | INTRAVENOUS | Status: AC
  Administered 2015-07-29: 1000 mL via INTRAVENOUS

## 2015-07-29 MED ORDER — IOHEXOL 300 MG/ML  SOLN
100.0000 mL | Freq: Once | INTRAMUSCULAR | Status: AC | PRN
  Administered 2015-07-29: 100 mL via INTRAVENOUS

## 2015-07-29 NOTE — Discharge Instructions (Signed)
Perirectal Abscess An abscess is an infected area that contains a collection of pus. A perirectal abscess is an abscess that is near the opening of the anus or around the rectum. A perirectal abscess can cause a lot of pain, especially during bowel movements. CAUSES This condition is almost always caused by an infection that starts in an anal gland. RISK FACTORS This condition is more likely to develop in:  People with diabetes or inflammatory bowel disease.  People whose body defense system (immune system) is weak.  People who have anal sex.  People who have a sexually transmitted disease (STD).  People who have certain kinds of cancers, such as rectal carcinoma, leukemia, or lymphoma. SYMPTOMS The main symptom of this condition is pain. The pain may be a throbbing pain that gets worse during bowel movements. Other symptoms include:  Fever.  Swelling.  Redness.  Bleeding.  Constipation. DIAGNOSIS The condition is diagnosed with a physical exam. If the abscess is not visible, a health care provider may need to place a finger inside the rectum to find the abscess. Sometimes, imaging tests are done to determine the size and location of the abscess. These tests may include:  An ultrasound.  An MRI.  A CT scan. TREATMENT This condition is usually treated with incision and drainage surgery. Incision and drainage surgery involves making an incision over the abscess to drain the pus. Treatment may also involve antibiotic medicine, pain medicine, stool softeners, or laxatives. HOME CARE INSTRUCTIONS  Take medicines only as directed by your health care provider.  If you were prescribed an antibiotic, finish all of it even if you start to feel better.  To relieve pain, try sitting:  In a warm, shallow bath (sitz bath).  On a heating pad with the setting on low.  On an inflatable donut-shaped cushion.  Follow any diet instructions as directed by your health care  provider.  Keep all follow-up visits as directed by your health care provider. This is important. SEEK MEDICAL CARE IF:  Your abscess is bleeding.  You have pain, swelling, or redness that is getting worse.  You are constipated.  You feel ill.  You have muscle aches or chills.  You have a fever.  Your symptoms return after the abscess has healed.   This information is not intended to replace advice given to you by your health care provider. Make sure you discuss any questions you have with your health care provider.   Document Released: 05/14/2000 Document Revised: 02/05/2015 Document Reviewed: 03/27/2014 [employer] Interactive Patient Education 2016 [employer]  

## 2015-07-29 NOTE — ED Provider Notes (Signed)
CSN: [id number]     Arrival date & time 07/29/15  1912 History  By signing my name below, I, Jon Wade, attest that this documentation has been prepared under the direction and in the presence of Margarita Mail, PA-C. Electronically Signed: Julien Wade, ED Scribe. 07/29/2015. 7:42 PM.     Chief Complaint  Patient presents with  . Wound Check      The history is provided by the patient. No language interpreter was used.   HPI Comments: Jon Wade is a 45 y.o. male who presents to the Emergency Department presenting with a wound check. He rates his current pain of the area about 3-4 but describes it as a more uncomfortable feeling. Pt states he had a cyst removed on his left buttocks about 7 months ago. He states the area was doing fine until about one month ago when the area started to have clear and bloody discharge. He notes over the past week the area has been drying up to a white crusty substance with associated night sweats. He has been having cold-like symptoms recently but he works outside so it is nothing out of the ordinary. Pt says the area is tender-like when touched.    Past Medical History  Diagnosis Date  . Arthritis     "hands maybe" (11/26/2014)   Past Surgical History  Procedure Laterality Date  . Knee arthroscopy Right 1988/1989  . Incision and drainage abscess Left 11/26/2014    buttocks  . Irrigation and debridement buttocks Left 11/26/2014    Procedure: Incision and Drainage of Left Buttock Abscess;  Surgeon: Donnie Mesa, MD;  Location: [facility] OR;  Service: General;  Laterality: Left;   Family History  Problem Relation Age of Onset  . Diabetes Mellitus II Father    Social History  Substance Use Topics  . Smoking status: Current Every Day Smoker -- 2.00 packs/day for 27 years    Types: Cigarettes  . Smokeless tobacco: Never Used  . Alcohol Use: 54.0 oz/week    90 Cans of beer per week     Comment: 11/26/2014 "4, 40oz beers most day; I drink q day"     Review of Systems  Constitutional: Positive for chills.  Respiratory: Positive for cough.   Musculoskeletal: Negative for myalgias.  Skin: Positive for wound.  All other systems reviewed and are negative.     Allergies  Review of patient's allergies indicates no known allergies.  Home Medications   Prior to Admission medications   Medication Sig Start Date End Date Taking? Authorizing Provider  ciprofloxacin (CIPRO) 500 MG tablet Take 1 tablet (500 mg total) by mouth 2 (two) times daily. 07/29/15   Margarita Mail, PA-C  methocarbamol (ROBAXIN) 500 MG tablet Take 1 tablet (500 mg total) by mouth 2 (two) times daily as needed for muscle spasms. Patient not taking: Reported on 12/30/2014 12/18/14   Waynetta Pean, PA-C  naproxen (NAPROSYN) 500 MG tablet Take 500 mg by mouth once as needed for mild pain.    Historical Provider, MD  oxyCODONE-acetaminophen (PERCOCET/ROXICET) 5-325 MG tablet Take 1-2 tablets by mouth every 4 (four) hours as needed for severe pain. 07/29/15   Margarita Mail, PA-C  polyethylene glycol (MIRALAX / GLYCOLAX) packet Take 17 g by mouth daily as needed for mild constipation.    Historical Provider, MD  polyethylene glycol powder (GLYCOLAX/MIRALAX) powder Take 17 g by mouth 2 (two) times daily. Until daily soft stools  OTC Patient not taking: Reported on 12/30/2014 12/18/14   Waynetta Pean,  PA-C   Triage vitals: BP 157/88 mmHg  Pulse 87  Temp(Src) 99 F (37.2 C) (Oral)  Resp 16  SpO2 98% Physical Exam  Constitutional: He is oriented to person, place, and time. He appears well-developed and well-nourished.  HENT:  Head: Normocephalic.  Eyes: EOM are normal.  Neck: Normal range of motion.  Pulmonary/Chest: Effort normal.  Abdominal: He exhibits no distension.  Musculoskeletal: Normal range of motion.  Left gluteal region, two punctate wounds that are tender with some cirrus discharge. Maceration of tissue around the area, no induration or fluctuance noted,  good rectal tone.   Neurological: He is alert and oriented to person, place, and time.  Psychiatric: He has a normal mood and affect.  Nursing note and vitals reviewed.   ED Course  Procedures  DIAGNOSTIC STUDIES: Oxygen Saturation is 98% on RA, normal by my interpretation.  COORDINATION OF CARE:  7:39 PM Will order CT pelvis. Discussed treatment plan with pt at bedside and pt agreed to plan.  Labs Review Labs Reviewed  I-STAT CHEM 8, ED - Abnormal; Notable for the following:    Hemoglobin 18.0 (*)    HCT 53.0 (*)    All other components within normal limits    Imaging Review No results found. I have personally reviewed and evaluated these images and lab results as part of my medical decision-making. Results for orders placed or performed during the hospital encounter of 07/29/15  I-stat chem 8, ed  Result Value Ref Range   Sodium 142 135 - 145 mmol/L   Potassium 4.2 3.5 - 5.1 mmol/L   Chloride 101 101 - 111 mmol/L   BUN 19 6 - 20 mg/dL   Creatinine, Ser 1.20 0.61 - 1.24 mg/dL   Glucose, Bld 77 65 - 99 mg/dL   Calcium, Ion 1.17 1.12 - 1.23 mmol/L   TCO2 28 0 - 100 mmol/L   Hemoglobin 18.0 (H) 13.0 - 17.0 g/dL   HCT 53.0 (H) 39.0 - 52.0 %     EKG Interpretation None      MDM   Final diagnoses:  Rectal pain   The patient has drainage from a perirectal abscess that was drained in the or 6 months ago. Will get labs and a ct pelvis.    Patient CT shows possible redeveloping abscess. i have discussed the case with Dr. Ninfa Linden who recommend s Cipro and follow up in clinic in the nexrt 48 hours,. Patient is comfortable with POC. Labs reviewed and and wnl. Except for polycythemia,.I personally performed the services described in this documentation, which was scribed in my presence. The recorded information has been reviewed and is accurate.     Margarita Mail, PA-C 08/01/15 [place], MD 08/01/15 [phone]

## 2015-07-29 NOTE — ED Notes (Signed)
Called for a flu swab to be sent from lab

## 2015-07-29 NOTE — Progress Notes (Signed)
ED CM noted patient to be without health insurance or a PCP.  Patient had a cyst removed on his left buttocks about 7 months ago. Today patient presents with similar complaints. Patient would benefit from follow up at the [facility]. ED CM met patient at bedside verified  Information, dicussed  establishing care for follow-up and primary care needs. Patient is agreeable.. Discussed the [facility]  And services rendered, patient is agreeable to establish care with the [facility] clinic.  Appt scheduled for 08/05/15 at 3pm with Dr. Jarold Song. Teach back done patient verbalized understanding. Updated A. Harris PA-C. No further ED CM needs identified.

## 2015-07-29 NOTE — ED Notes (Signed)
Pt states he had a cyst removed from left buttocks in July ans for the last month pt states he has had pain and drainage from the area with a foul smell. Pt also reports cold sweats at night.

## 2015-07-29 NOTE — ED Notes (Signed)
Patient transported to CT 

## 2015-07-30 NOTE — ED Notes (Signed)
Patient able to dress and ambulate independently 

## 2015-08-05 ENCOUNTER — Ambulatory Visit: Payer: Self-pay | Attending: Family Medicine | Admitting: Family Medicine

## 2015-08-05 ENCOUNTER — Encounter: Payer: Self-pay | Admitting: Family Medicine

## 2015-08-05 VITALS — BP 150/90 | HR 100 | Temp 98.0°F | Resp 15 | Ht 69.0 in | Wt 277.0 lb

## 2015-08-05 DIAGNOSIS — R030 Elevated blood-pressure reading, without diagnosis of hypertension: Secondary | ICD-10-CM | POA: Insufficient documentation

## 2015-08-05 DIAGNOSIS — K611 Rectal abscess: Secondary | ICD-10-CM

## 2015-08-05 DIAGNOSIS — Z79899 Other long term (current) drug therapy: Secondary | ICD-10-CM | POA: Insufficient documentation

## 2015-08-05 DIAGNOSIS — L0291 Cutaneous abscess, unspecified: Secondary | ICD-10-CM | POA: Insufficient documentation

## 2015-08-05 DIAGNOSIS — IMO0001 Reserved for inherently not codable concepts without codable children: Secondary | ICD-10-CM

## 2015-08-05 NOTE — Progress Notes (Signed)
Subjective:  Patient ID: Jon Wade, male    DOB: 12/29/1970  Age: 45 y.o. MRN: [id number]  CC: Follow-up   HPI Jon Wade presents for a follow-up of perirectal abscess for which he was seen in the ED on 07/29/15 and placed on ciprofloxacin with recommendations to follow up with general surgery in 2 days which he never did.  Medical history significant for I&D of left buttock abscess by Dr. Georgette Dover on 11-26-14. He went home with dressing changes and a penrose drain. He was supposed to see Dr. Georgette Dover in follow up, but never did. He informs me that he thought the abscess had resolved and so he did not need to follow-up until a month ago when he noted  bloody discharge from his perirectal area and chills and presented to the ED.  Outpatient Prescriptions Prior to Visit  Medication Sig Dispense Refill  . ciprofloxacin (CIPRO) 500 MG tablet Take 1 tablet (500 mg total) by mouth 2 (two) times daily. 20 tablet 0  . oxyCODONE-acetaminophen (PERCOCET/ROXICET) 5-325 MG tablet Take 1-2 tablets by mouth every 4 (four) hours as needed for severe pain. 20 tablet 0  . methocarbamol (ROBAXIN) 500 MG tablet Take 1 tablet (500 mg total) by mouth 2 (two) times daily as needed for muscle spasms. (Patient not taking: Reported on 12/30/2014) 20 tablet 0  . naproxen (NAPROSYN) 500 MG tablet Take 500 mg by mouth once as needed for mild pain. Reported on 08/05/2015    . polyethylene glycol (MIRALAX / GLYCOLAX) packet Take 17 g by mouth daily as needed for mild constipation. Reported on 08/05/2015    . polyethylene glycol powder (GLYCOLAX/MIRALAX) powder Take 17 g by mouth 2 (two) times daily. Until daily soft stools  OTC (Patient not taking: Reported on 12/30/2014) 250 g 0   No facility-administered medications prior to visit.    ROS Review of Systems  Constitutional: Negative for activity change and appetite change.  HENT: Negative for sinus pressure and sore throat.   Respiratory: Negative for chest tightness,  shortness of breath and wheezing.   Cardiovascular: Negative for chest pain and palpitations.  Gastrointestinal: Positive for rectal pain. Negative for abdominal pain, constipation and abdominal distention.  Genitourinary: Negative.   Musculoskeletal: Negative.   Psychiatric/Behavioral: Negative for behavioral problems and dysphoric mood.    Objective:  BP 150/90 mmHg  Pulse 100  Temp(Src) 98 F (36.7 C)  Resp 15  Ht 5\' 9"  (1.753 m)  Wt 277 lb (125.646 kg)  BMI 40.89 kg/m2  SpO2 95%  BP/Weight 08/05/2015 Q000111Q [id number]  Systolic BP Q000111Q Q000111Q [id number]  Diastolic BP 90 81 86  Wt. (Lbs) 277 - -  BMI 40.89 - -      Physical Exam  Constitutional: He is oriented to person, place, and time. He appears well-developed and well-nourished.  Cardiovascular: Normal rate, normal heart sounds and intact distal pulses.   No murmur heard. Pulmonary/Chest: Effort normal and breath sounds normal. He has no wheezes. He has no rales. He exhibits no tenderness.  Abdominal: Soft. Bowel sounds are normal. He exhibits no distension and no mass. There is no tenderness.  Genitourinary:  Abscess on the medial aspect of left gluteus muscle which is tender with minimal drainage  Musculoskeletal: Normal range of motion.  Neurological: He is alert and oriented to person, place, and time.     Assessment & Plan:   1. Perirectal abscess Continue Cipro Discussed ED precautions - Ambulatory referral to General Surgery  2. Elevated blood pressure  Advised on lifestyle changes-low-sodium, DASH diet and exercise We'll review blood pressure next office visit.   No orders of the defined types were placed in this encounter.    Follow-up: Return in about 1 month (around 09/05/2015) for Follow-up on elevated blood pressure.   Arnoldo Morale MD

## 2015-08-05 NOTE — Progress Notes (Signed)
Patient complains his abscess has returned and is draining ED gave cipro and pain meds

## 2015-08-05 NOTE — Patient Instructions (Signed)
Perirectal Abscess An abscess is an infected area that contains a collection of pus. A perirectal abscess is an abscess that is near the opening of the anus or around the rectum. A perirectal abscess can cause a lot of pain, especially during bowel movements. CAUSES This condition is almost always caused by an infection that starts in an anal gland. RISK FACTORS This condition is more likely to develop in:  People with diabetes or inflammatory bowel disease.  People whose body defense system (immune system) is weak.  People who have anal sex.  People who have a sexually transmitted disease (STD).  People who have certain kinds of cancers, such as rectal carcinoma, leukemia, or lymphoma. SYMPTOMS The main symptom of this condition is pain. The pain may be a throbbing pain that gets worse during bowel movements. Other symptoms include:  Fever.  Swelling.  Redness.  Bleeding.  Constipation. DIAGNOSIS The condition is diagnosed with a physical exam. If the abscess is not visible, a health care provider may need to place a finger inside the rectum to find the abscess. Sometimes, imaging tests are done to determine the size and location of the abscess. These tests may include:  An ultrasound.  An MRI.  A CT scan. TREATMENT This condition is usually treated with incision and drainage surgery. Incision and drainage surgery involves making an incision over the abscess to drain the pus. Treatment may also involve antibiotic medicine, pain medicine, stool softeners, or laxatives. HOME CARE INSTRUCTIONS  Take medicines only as directed by your health care provider.  If you were prescribed an antibiotic, finish all of it even if you start to feel better.  To relieve pain, try sitting:  In a warm, shallow bath (sitz bath).  On a heating pad with the setting on low.  On an inflatable donut-shaped cushion.  Follow any diet instructions as directed by your health care  provider.  Keep all follow-up visits as directed by your health care provider. This is important. SEEK MEDICAL CARE IF:  Your abscess is bleeding.  You have pain, swelling, or redness that is getting worse.  You are constipated.  You feel ill.  You have muscle aches or chills.  You have a fever.  Your symptoms return after the abscess has healed.   This information is not intended to replace advice given to you by your health care provider. Make sure you discuss any questions you have with your health care provider.   Document Released: 05/14/2000 Document Revised: 02/05/2015 Document Reviewed: 03/27/2014 Elsevier Interactive Patient Education 2016 [employer]  

## 2015-08-06 ENCOUNTER — Other Ambulatory Visit: Payer: Self-pay | Admitting: Surgery

## 2015-08-25 ENCOUNTER — Other Ambulatory Visit: Payer: Self-pay | Admitting: Surgery

## 2015-09-05 ENCOUNTER — Ambulatory Visit: Payer: Self-pay | Admitting: Family Medicine

## 2017-06-14 ENCOUNTER — Other Ambulatory Visit: Payer: Self-pay | Admitting: Surgery

## 2017-07-29 ENCOUNTER — Encounter (HOSPITAL_BASED_OUTPATIENT_CLINIC_OR_DEPARTMENT_OTHER): Payer: Self-pay | Admitting: *Deleted

## 2017-07-29 ENCOUNTER — Other Ambulatory Visit: Payer: Self-pay

## 2017-08-02 NOTE — H&P (Signed)
Jon Wade  Location: [facility] Patient #: [id number] DOB: September 26, 1970 Single / Language: Jon Wade / Race: White Male   History of Present Illness  The patient is a 47 year old male who presents with anal fistula. This gentleman is here for long-term follow-up regarding his anal fistula. It is been almost 2 years since I seen him. He has had no change in his medical care. He still has issues with draining from the fistula site. He was unable to have definitive surgery secondary to financial reasons. He moves his bowels daily and some pain with bowel movements. He reports some purulence but mostly mucus coming from the fistula site as well as blood. He has no issues with incontinence. He has not had a colonoscopy secondary to financial reasons and refuses to have one.   Past Surgical History Jon Wade, CMA;  Anal Fissure Repair   Diagnostic Studies History Jon Wade, CMA;  Colonoscopy  never  Allergies Jon Wade, CMA;  No Known Drug Allergies [08/06/2015]:  Medication History Jon Wade, CMA;  No Current Medications Medications Reconciled  Social History Jon Wade, CMA; Alcohol use  Heavy alcohol use. Caffeine use  Coffee. Illicit drug use  Prefer to discuss with provider. Tobacco use  Current every day smoker.  Family History Jon Wade, CMA;  Alcohol Abuse  Father. Arthritis  Mother. Colon Polyps  Father. Depression  Mother. Diabetes Mellitus  Father. Heart Disease  Father. Heart disease in male family member before age 88  Hypertension  Father. Respiratory Condition  Father.  Other Problems Jon Wade, CMA;  Alcohol Abuse  Anxiety Disorder  Chest pain  Depression  Emphysema Of Lung  Hemorrhoids  High blood pressure     Review of Systems (Jon Wade CMA;  General Present- Chills, Fever and Night Sweats. Not Present- Appetite Loss, Fatigue, Weight Gain and Weight Loss. Skin  Present- Non-Healing Wounds. Not Present- Change in Wart/Mole, Dryness, Hives, Jaundice, New Lesions, Rash and Ulcer. HEENT Present- Sore Throat, Visual Disturbances and Yellow Eyes. Not Present- Earache, Hearing Loss, Hoarseness, Nose Bleed, Oral Ulcers, Ringing in the Ears, Seasonal Allergies, Sinus Pain and Wears glasses/contact lenses. Respiratory Not Present- Bloody sputum, Chronic Cough, Difficulty Breathing, Snoring and Wheezing. Breast Not Present- Breast Mass, Breast Pain, Nipple Discharge and Skin Changes. Cardiovascular Present- Chest Pain. Not Present- Difficulty Breathing Lying Down, Leg Cramps, Palpitations, Rapid Heart Rate, Shortness of Breath and Swelling of Extremities. Gastrointestinal Present- Bloating and Hemorrhoids. Not Present- Abdominal Pain, Bloody Stool, Change in Bowel Habits, Chronic diarrhea, Constipation, Difficulty Swallowing, Excessive gas, Gets full quickly at meals, Indigestion, Nausea, Rectal Pain and Vomiting. Male Genitourinary Not Present- Blood in Urine, Change in Urinary Stream, Frequency, Impotence, Nocturia, Painful Urination, Urgency and Urine Leakage. Musculoskeletal Present- Back Pain. Not Present- Joint Pain, Joint Stiffness, Muscle Pain, Muscle Weakness and Swelling of Extremities. Neurological Present- Decreased Memory. Not Present- Fainting, Headaches, Numbness, Seizures, Tingling, Tremor, Trouble walking and Weakness. Psychiatric Present- Anxiety and Bipolar. Not Present- Change in Sleep Pattern, Depression, Fearful and Frequent crying. Endocrine Present- Hot flashes. Not Present- Cold Intolerance, Excessive Hunger, Hair Changes, Heat Intolerance and New Diabetes. Hematology Present- Persistent Infections. Not Present- Easy Bruising, Excessive bleeding, Gland problems and HIV.  Vitals Jon Wade CMA;  Weight: 251.2 lb Height: 69.5in Body Surface Area: 2.29 m Body Mass Index: 36.56 kg/m  Temp.: 97.10F(Oral)  Pulse: 89 (Regular)       Physical Exam (Jon Wade A. Ninfa Linden MD;  The physical exam findings are as follows: Note:On physical  examination, there is an opening on the left perianal area consistent with his chronic fistula. There is some mucus but no purulence. There is mild induration. The rest of his external anal examination is normal. 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 I believe he needs an examination under anesthesia and fistulotomy. We discussed this in detail. We discussed the risks of surgery which includes but is not limited to bleeding, infection, incontinence, recurrence, postoperative recovery, etc. He understands and wished to proceed with surgery this time which will be scheduled

## 2017-08-03 ENCOUNTER — Encounter (HOSPITAL_BASED_OUTPATIENT_CLINIC_OR_DEPARTMENT_OTHER): Payer: Self-pay | Admitting: Anesthesiology

## 2017-08-03 ENCOUNTER — Ambulatory Visit (HOSPITAL_BASED_OUTPATIENT_CLINIC_OR_DEPARTMENT_OTHER)
Admission: RE | Admit: 2017-08-03 | Discharge: 2017-08-03 | Disposition: A | Discharge status: Home / Self Care | Payer: Self-pay | Source: Ambulatory Visit | Attending: Surgery | Admitting: Surgery

## 2017-08-03 ENCOUNTER — Encounter (HOSPITAL_BASED_OUTPATIENT_CLINIC_OR_DEPARTMENT_OTHER)
Admission: RE | Disposition: A | Discharge status: Home / Self Care | Payer: Self-pay | Source: Ambulatory Visit | Attending: Surgery

## 2017-08-03 ENCOUNTER — Other Ambulatory Visit: Payer: Self-pay

## 2017-08-03 ENCOUNTER — Ambulatory Visit (HOSPITAL_BASED_OUTPATIENT_CLINIC_OR_DEPARTMENT_OTHER): Payer: Self-pay | Admitting: Anesthesiology

## 2017-08-03 DIAGNOSIS — R079 Chest pain, unspecified: Secondary | ICD-10-CM | POA: Insufficient documentation

## 2017-08-03 DIAGNOSIS — K603 Anal fistula: Secondary | ICD-10-CM | POA: Insufficient documentation

## 2017-08-03 DIAGNOSIS — Z7289 Other problems related to lifestyle: Secondary | ICD-10-CM | POA: Insufficient documentation

## 2017-08-03 DIAGNOSIS — F319 Bipolar disorder, unspecified: Secondary | ICD-10-CM | POA: Insufficient documentation

## 2017-08-03 DIAGNOSIS — Z8249 Family history of ischemic heart disease and other diseases of the circulatory system: Secondary | ICD-10-CM | POA: Insufficient documentation

## 2017-08-03 DIAGNOSIS — M1990 Unspecified osteoarthritis, unspecified site: Secondary | ICD-10-CM | POA: Insufficient documentation

## 2017-08-03 DIAGNOSIS — I10 Essential (primary) hypertension: Secondary | ICD-10-CM | POA: Insufficient documentation

## 2017-08-03 DIAGNOSIS — F419 Anxiety disorder, unspecified: Secondary | ICD-10-CM | POA: Insufficient documentation

## 2017-08-03 DIAGNOSIS — F17200 Nicotine dependence, unspecified, uncomplicated: Secondary | ICD-10-CM | POA: Insufficient documentation

## 2017-08-03 DIAGNOSIS — E669 Obesity, unspecified: Secondary | ICD-10-CM | POA: Insufficient documentation

## 2017-08-03 HISTORY — DX: Nicotine dependence, unspecified, uncomplicated: F17.200

## 2017-08-03 HISTORY — DX: Anal fistula, unspecified: K60.30

## 2017-08-03 HISTORY — PX: ANAL FISTULOTOMY: SHX6423

## 2017-08-03 HISTORY — DX: Anal fistula: K60.3

## 2017-08-03 HISTORY — DX: Anxiety disorder, unspecified: F41.9

## 2017-08-03 HISTORY — DX: Childhood onset fluency disorder: F80.81

## 2017-08-03 LAB — POCT I-STAT, CHEM 8
BUN: 21 mg/dL — AB (ref 6–20)
CALCIUM ION: 1.09 mmol/L — AB (ref 1.15–1.40)
CREATININE: 0.8 mg/dL (ref 0.61–1.24)
Chloride: 103 mmol/L (ref 101–111)
GLUCOSE: 105 mg/dL — AB (ref 65–99)
HCT: 49 % (ref 39.0–52.0)
Hemoglobin: 16.7 g/dL (ref 13.0–17.0)
Potassium: 4.5 mmol/L (ref 3.5–5.1)
SODIUM: 139 mmol/L (ref 135–145)
TCO2: 27 mmol/L (ref 22–32)

## 2017-08-03 SURGERY — ANAL FISTULOTOMY
Anesthesia: General | Site: Anus

## 2017-08-03 MED ORDER — CEFAZOLIN SODIUM-DEXTROSE 2-4 GM/100ML-% IV SOLN
2.0000 g | INTRAVENOUS | Status: AC
  Administered 2017-08-03: 2 g via INTRAVENOUS

## 2017-08-03 MED ORDER — ONDANSETRON HCL 4 MG/2ML IJ SOLN
INTRAMUSCULAR | Status: AC
  Filled 2017-08-03: qty 2

## 2017-08-03 MED ORDER — CHLORHEXIDINE GLUCONATE CLOTH 2 % EX PADS: 6.0000 | MEDICATED_PAD | Freq: Once | CUTANEOUS | Status: DC

## 2017-08-03 MED ORDER — SODIUM CHLORIDE 0.9 % IJ SOLN
INTRAMUSCULAR | Status: AC
  Filled 2017-08-03: qty 10

## 2017-08-03 MED ORDER — PROMETHAZINE HCL 25 MG/ML IJ SOLN: 6.2500 mg | INTRAMUSCULAR | Status: DC | PRN

## 2017-08-03 MED ORDER — LACTATED RINGERS IV SOLN: INTRAVENOUS | Status: DC

## 2017-08-03 MED ORDER — PROPOFOL 10 MG/ML IV BOLUS
INTRAVENOUS | Status: AC
  Filled 2017-08-03: qty 20

## 2017-08-03 MED ORDER — MIDAZOLAM HCL 2 MG/2ML IJ SOLN
1.0000 mg | INTRAMUSCULAR | Status: DC | PRN
  Administered 2017-08-03: 2 mg via INTRAVENOUS

## 2017-08-03 MED ORDER — SCOPOLAMINE 1 MG/3DAYS TD PT72: 1.0000 | MEDICATED_PATCH | Freq: Once | TRANSDERMAL | Status: DC | PRN

## 2017-08-03 MED ORDER — FENTANYL CITRATE (PF) 100 MCG/2ML IJ SOLN
INTRAMUSCULAR | Status: AC
  Filled 2017-08-03: qty 2

## 2017-08-03 MED ORDER — BUPIVACAINE HCL (PF) 0.25 % IJ SOLN
INTRAMUSCULAR | Status: AC
  Filled 2017-08-03: qty 30

## 2017-08-03 MED ORDER — PROPOFOL 10 MG/ML IV BOLUS
INTRAVENOUS | Status: DC | PRN
  Administered 2017-08-03 (×2): 50 mg via INTRAVENOUS
  Administered 2017-08-03: 200 mg via INTRAVENOUS

## 2017-08-03 MED ORDER — DEXAMETHASONE SODIUM PHOSPHATE 10 MG/ML IJ SOLN
INTRAMUSCULAR | Status: AC
Start: 2017-08-03 — End: 2017-08-03
  Filled 2017-08-03: qty 1

## 2017-08-03 MED ORDER — CEFAZOLIN SODIUM-DEXTROSE 2-4 GM/100ML-% IV SOLN
INTRAVENOUS | Status: AC
  Filled 2017-08-03: qty 100

## 2017-08-03 MED ORDER — FENTANYL CITRATE (PF) 100 MCG/2ML IJ SOLN
50.0000 ug | INTRAMUSCULAR | Status: DC | PRN
  Administered 2017-08-03: 100 ug via INTRAVENOUS

## 2017-08-03 MED ORDER — BUPIVACAINE LIPOSOME 1.3 % IJ SUSP
INTRAMUSCULAR | Status: DC | PRN
  Administered 2017-08-03: 20 mL

## 2017-08-03 MED ORDER — OXYCODONE HCL 5 MG/5ML PO SOLN: 5.0000 mg | Freq: Once | ORAL | Status: DC | PRN

## 2017-08-03 MED ORDER — MEPERIDINE HCL 25 MG/ML IJ SOLN: 6.2500 mg | INTRAMUSCULAR | Status: DC | PRN

## 2017-08-03 MED ORDER — DEXAMETHASONE SODIUM PHOSPHATE 4 MG/ML IJ SOLN
INTRAMUSCULAR | Status: DC | PRN
  Administered 2017-08-03: 10 mg via INTRAVENOUS

## 2017-08-03 MED ORDER — OXYCODONE HCL 5 MG PO TABS: 5.0000 mg | ORAL_TABLET | Freq: Four times a day (QID) | ORAL | 0 refills | Status: DC | PRN

## 2017-08-03 MED ORDER — LIDOCAINE 5 % EX OINT: 1.0000 "application " | TOPICAL_OINTMENT | CUTANEOUS | 1 refills | Status: DC | PRN

## 2017-08-03 MED ORDER — ONDANSETRON HCL 4 MG/2ML IJ SOLN
INTRAMUSCULAR | Status: DC | PRN
  Administered 2017-08-03: 4 mg via INTRAVENOUS

## 2017-08-03 MED ORDER — BUPIVACAINE HCL (PF) 0.25 % IJ SOLN
INTRAMUSCULAR | Status: DC | PRN
  Administered 2017-08-03: 20 mL

## 2017-08-03 MED ORDER — LACTATED RINGERS IV SOLN
INTRAVENOUS | Status: DC
  Administered 2017-08-03: 10:00:00 via INTRAVENOUS

## 2017-08-03 MED ORDER — SUCCINYLCHOLINE CHLORIDE 20 MG/ML IJ SOLN
INTRAMUSCULAR | Status: DC | PRN
  Administered 2017-08-03: 120 mg via INTRAVENOUS

## 2017-08-03 MED ORDER — ALBUTEROL SULFATE HFA 108 (90 BASE) MCG/ACT IN AERS
INHALATION_SPRAY | RESPIRATORY_TRACT | Status: DC | PRN
  Administered 2017-08-03 (×2): 2 via RESPIRATORY_TRACT

## 2017-08-03 MED ORDER — BUPIVACAINE LIPOSOME 1.3 % IJ SUSP
INTRAMUSCULAR | Status: AC
  Filled 2017-08-03: qty 20

## 2017-08-03 MED ORDER — OXYCODONE HCL 5 MG PO TABS: 5.0000 mg | ORAL_TABLET | Freq: Once | ORAL | Status: DC | PRN

## 2017-08-03 MED ORDER — KETOROLAC TROMETHAMINE 30 MG/ML IJ SOLN
INTRAMUSCULAR | Status: DC | PRN
  Administered 2017-08-03: 30 mg via INTRAVENOUS

## 2017-08-03 MED ORDER — ESMOLOL HCL 100 MG/10ML IV SOLN
INTRAVENOUS | Status: AC
  Filled 2017-08-03: qty 10

## 2017-08-03 MED ORDER — GLYCOPYRROLATE 0.2 MG/ML IJ SOLN
INTRAMUSCULAR | Status: DC | PRN
  Administered 2017-08-03: 0.2 mg via INTRAVENOUS

## 2017-08-03 MED ORDER — LIDOCAINE 2% (20 MG/ML) 5 ML SYRINGE
INTRAMUSCULAR | Status: DC | PRN
  Administered 2017-08-03: 100 mg via INTRAVENOUS

## 2017-08-03 MED ORDER — MIDAZOLAM HCL 2 MG/2ML IJ SOLN
INTRAMUSCULAR | Status: AC
  Filled 2017-08-03: qty 2

## 2017-08-03 MED ORDER — HYDROMORPHONE HCL 1 MG/ML IJ SOLN: 0.2500 mg | INTRAMUSCULAR | Status: DC | PRN

## 2017-08-03 SURGICAL SUPPLY — 36 items
BLADE SURG 15 STRL LF DISP TIS (BLADE) ×2 IMPLANT
BLADE SURG 15 STRL SS (BLADE) ×2
BRIEF STRETCH FOR OB PAD XXL (UNDERPADS AND DIAPERS) ×4 IMPLANT
CANISTER SUCT 1200ML W/VALVE (MISCELLANEOUS) ×4 IMPLANT
COVER MAYO STAND STRL (DRAPES) IMPLANT
DECANTER SPIKE VIAL GLASS SM (MISCELLANEOUS) ×4 IMPLANT
DRAPE UTILITY XL STRL (DRAPES) IMPLANT
DRSG PAD ABDOMINAL 8X10 ST (GAUZE/BANDAGES/DRESSINGS) ×4 IMPLANT
ELECT REM PT RETURN 9FT ADLT (ELECTROSURGICAL) ×4
ELECTRODE REM PT RTRN 9FT ADLT (ELECTROSURGICAL) ×2 IMPLANT
GAUZE SPONGE 4X4 12PLY STRL LF (GAUZE/BANDAGES/DRESSINGS) ×4 IMPLANT
GLOVE BIOGEL PI IND STRL 7.0 (GLOVE) ×4 IMPLANT
GLOVE BIOGEL PI INDICATOR 7.0 (GLOVE) ×4
GLOVE ECLIPSE 6.5 STRL STRAW (GLOVE) ×4 IMPLANT
GLOVE SURG SIGNA 7.5 PF LTX (GLOVE) ×4 IMPLANT
GOWN STRL REUS W/ TWL LRG LVL3 (GOWN DISPOSABLE) ×2 IMPLANT
GOWN STRL REUS W/ TWL XL LVL3 (GOWN DISPOSABLE) ×2 IMPLANT
GOWN STRL REUS W/TWL LRG LVL3 (GOWN DISPOSABLE) ×2
GOWN STRL REUS W/TWL XL LVL3 (GOWN DISPOSABLE) ×2
NEEDLE HYPO 25X1 1.5 SAFETY (NEEDLE) ×4 IMPLANT
PACK BASIN DAY SURGERY FS (CUSTOM PROCEDURE TRAY) ×4 IMPLANT
PACK LITHOTOMY IV (CUSTOM PROCEDURE TRAY) ×4 IMPLANT
PENCIL BUTTON HOLSTER BLD 10FT (ELECTRODE) ×4 IMPLANT
SPONGE SURGIFOAM ABS GEL 100 (HEMOSTASIS) ×4 IMPLANT
SPONGE SURGIFOAM ABS GEL 12-7 (HEMOSTASIS) ×8 IMPLANT
SURGILUBE 2OZ TUBE FLIPTOP (MISCELLANEOUS) ×4 IMPLANT
SUT CHROMIC 3 0 SH 27 (SUTURE) ×4 IMPLANT
SYR CONTROL 10ML LL (SYRINGE) ×4 IMPLANT
TOWEL OR 17X24 6PK STRL BLUE (TOWEL DISPOSABLE) ×8 IMPLANT
TOWEL OR NON WOVEN STRL DISP B (DISPOSABLE) IMPLANT
TRAY DSU PREP LF (CUSTOM PROCEDURE TRAY) ×4 IMPLANT
TRAY PROCTOSCOPIC FIBER OPTIC (SET/KITS/TRAYS/PACK) IMPLANT
TUBE CONNECTING 20'X1/4 (TUBING) ×1
TUBE CONNECTING 20X1/4 (TUBING) ×3 IMPLANT
UNDERPAD 30X30 (UNDERPADS AND DIAPERS) ×4 IMPLANT
YANKAUER SUCT BULB TIP NO VENT (SUCTIONS) ×4 IMPLANT

## 2017-08-03 NOTE — Anesthesia Procedure Notes (Addendum)
Procedure Name: LMA Insertion Date/Time: 08/03/2017 10:33 AM Performed by: Lieutenant Diego, CRNA Pre-anesthesia Checklist: Patient identified, Emergency Drugs available, Suction available and Patient being monitored Patient Re-evaluated:Patient Re-evaluated prior to induction Oxygen Delivery Method: Circle system utilized Preoxygenation: Pre-oxygenation with 100% oxygen Induction Type: IV induction Ventilation: Mask ventilation without difficulty LMA: LMA inserted LMA Size: 4.0 Number of attempts: 1 Airway Equipment and Method: Bite block Placement Confirmation: positive ETCO2 Tube secured with: Tape Dental Injury: Teeth and Oropharynx as per pre-operative assessment  Comments: Not able to vent more than 100 cc TV. Removed and tried 5 LMA, same issue. Pulled and intubated.

## 2017-08-03 NOTE — Interval H&P Note (Signed)
History and Physical Interval Note: no change in H and P  08/03/2017 9:20 AM  [facility]  has presented today for surgery, with the diagnosis of ANAL FISTULA  The various methods of treatment have been discussed with the patient and family. After consideration of risks, benefits and other options for treatment, the patient has consented to  Procedure(s): EXAM UNDER ANESTHESIA WITH ANAL FISTULOTOMY (N/A) as a surgical intervention .  The patient's history has been reviewed, patient examined, no change in status, stable for surgery.  I have reviewed the patient's chart and labs.  Questions were answered to the patient's satisfaction.     Midge Momon A

## 2017-08-03 NOTE — Anesthesia Preprocedure Evaluation (Addendum)
Anesthesia Evaluation  Patient identified by MRN, date of birth, ID band Patient awake    Reviewed: Allergy & Precautions, NPO status , Patient's Chart, lab work & pertinent test results  Airway Mallampati: II  TM Distance: >3 FB Neck ROM: Full    Dental  (+) Teeth Intact, Dental Advisory Given   Pulmonary Current Smoker,    breath sounds clear to auscultation       Cardiovascular negative cardio ROS   Rhythm:Regular Rate:Normal     Neuro/Psych PSYCHIATRIC DISORDERS Anxiety negative neurological ROS     GI/Hepatic negative GI ROS, Neg liver ROS,   Endo/Other  negative endocrine ROS  Renal/GU negative Renal ROS     Musculoskeletal  (+) Arthritis , Osteoarthritis,    Abdominal (+) + obese,   Peds  Hematology negative hematology ROS (+)   Anesthesia Other Findings   Reproductive/Obstetrics                            Anesthesia Physical Anesthesia Plan  ASA: II  Anesthesia Plan: General   Post-op Pain Management:    Induction: Intravenous  PONV Risk Score and Plan: 2 and Ondansetron, Dexamethasone and Midazolam  Airway Management Planned: LMA  Additional Equipment: None  Intra-op Plan:   Post-operative Plan: Extubation in OR  Informed Consent: I have reviewed the patients History and Physical, chart, labs and discussed the procedure including the risks, benefits and alternatives for the proposed anesthesia with the patient or authorized representative who has indicated his/her understanding and acceptance.   Dental advisory given  Plan Discussed with: CRNA  Anesthesia Plan Comments:         Anesthesia Quick Evaluation

## 2017-08-03 NOTE — Discharge Instructions (Signed)
[facility] _______[facility] ° °RECTAL SURGERY POST OP INSTRUCTIONS: POST OP INSTRUCTIONS ° °Always review your discharge instruction sheet given to you by the facility where your surgery was performed. °IF YOU HAVE DISABILITY OR FAMILY LEAVE FORMS, YOU MUST BRING THEM TO THE OFFICE FOR PROCESSING.   °DO NOT GIVE THEM TO YOUR DOCTOR. ° °1. A  prescription for pain medication may be given to you upon discharge.  Take your pain medication as prescribed, if needed.  If narcotic pain medicine is not needed, then you may take acetaminophen (Tylenol) or ibuprofen (Advil) as needed. °2. Take your usually prescribed medications unless otherwise directed. °3. If you need a refill on your pain medication, please contact your pharmacy.  They will contact our office to request authorization. Prescriptions will not be filled after 5 pm or on week-ends. °4. You should follow a light diet the first 48 hours after arrival home, such as soup and crackers, etc.  Be sure to include lots of fluids daily.  Resume your normal diet 2-3 days after surgery.. °5. Most patients will experience some swelling and discomfort in the rectal area. Ice packs, reclining and warm tub soaks will help.  Swelling and discomfort can take several days to resolve.  °6. It is common to experience some constipation if taking pain medication after surgery.  Increasing fluid intake and taking a stool softener (such as Colace) will usually help or prevent this problem from occurring.  A mild laxative (Milk of Magnesia or Miralax) should be taken according to package directions if there are no bowel movements after 48 hours. °7. Unless discharge instructions indicate otherwise, leave your bandage dry and in place for 24 hours, or remove the bandage if you have a bowel movement. You may notice a small amount of bleeding with bowel movements for the first few days. You may have some packing in the rectum which will come out over the first day or two. You  will need to wear an absorbent pad or soft cotton gauze in your underwear until the drainage stops.it. °8. ACTIVITIES:  You may resume regular (light) daily activities beginning the next day--such as daily self-care, walking, climbing stairs--gradually increasing activities as tolerated.  You may have sexual intercourse when it is comfortable.  Refrain from any heavy lifting or straining until approved by your doctor. °a. You may drive when you are no longer taking prescription pain medication, you can comfortably wear a seatbelt, and you can safely maneuver your car and apply brakes. °b. RETURN TO WORK: : ____________________ °c.  °9. You should see your doctor in the office for a follow-up appointment approximately 2-3 weeks after your surgery.  Make sure that you call for this appointment within a day or two after you arrive home to insure a convenient appointment time. °10. OTHER INSTRUCTIONS:  __________________________________________________________________________________________________________________________________________________________________________________________  °WHEN TO CALL YOUR DOCTOR: °1. Fever over 101.0 °2. Inability to urinate °3. Nausea and/or vomiting °4. Extreme swelling or bruising °5. Continued bleeding from rectum. °6. Increased pain, redness, or drainage from the incision °7. Constipation ° °The clinic staff is available to answer your questions during regular business hours.  Please don’t hesitate to call and ask to speak to one of the nurses for clinical concerns.  If you have a medical emergency, go to the nearest emergency room or call 911.  A surgeon from [facility] is always on call at the hospital ° ° °[address], [address], [place], [place]  [address] ? °   [id number], [place], [place]   [address] [phone] ? [phone] ? FAX [phone] Web site: [email or web address]   Post Anesthesia Home Care Instructions  Activity: Get  plenty of rest for the remainder of the day. A responsible individual must stay with you for 24 hours following the procedure.  For the next 24 hours, DO NOT: -Drive a car -Paediatric nurse -Drink alcoholic beverages -Take any medication unless instructed by your physician -Make any legal decisions or sign important papers.  Meals: Start with liquid foods such as gelatin or soup. Progress to regular foods as tolerated. Avoid greasy, spicy, heavy foods. If nausea and/or vomiting occur, drink only clear liquids until the nausea and/or vomiting subsides. Call your physician if vomiting continues.  Special Instructions/Symptoms: Your throat may feel dry or sore from the anesthesia or the breathing tube placed in your throat during surgery. If this causes discomfort, gargle with warm salt water. The discomfort should disappear within 24 hours.  If you had a scopolamine patch placed behind your ear for the management of post- operative nausea and/or vomiting:  1. The medication in the patch is effective for 72 hours, after which it should be removed.  Wrap patch in a tissue and discard in the trash. Wash hands thoroughly with soap and water. 2. You may remove the patch earlier than 72 hours if you experience unpleasant side effects which may include dry mouth, dizziness or visual disturbances. 3. Avoid touching the patch. Wash your hands with soap and water after contact with the patch.

## 2017-08-03 NOTE — Transfer of Care (Signed)
Immediate Anesthesia Transfer of Care Note  Patient: Jon Wade  Procedure(s) Performed: EXAM UNDER ANESTHESIA WITH ANAL FISTULOTOMY (N/A Anus)  Patient Location: PACU  Anesthesia Type:General  Level of Consciousness: awake and alert   Airway & Oxygen Therapy: Patient Spontanous Breathing and Patient connected to face mask oxygen  Post-op Assessment: Report given to RN and Post -op Vital signs reviewed and stable  Post vital signs: Reviewed and stable  Last Vitals:  Vitals:   08/03/17 0929 08/03/17 1109  BP: 130/72   Pulse: 66   Resp: 18   Temp: 36.9 C (P) 36.9 C    Last Pain:  Vitals:   08/03/17 0929  TempSrc: Oral         Complications: No apparent anesthesia complications

## 2017-08-03 NOTE — Op Note (Addendum)
EXAM UNDER ANESTHESIA WITH ANAL FISTULOTOMY  Procedure Note  Denim Kalmbach [facility] 08/03/2017   Pre-op Diagnosis: ANAL FISTULA     Post-op Diagnosis: same  Procedure(s): EXAM UNDER ANESTHESIA WITH ANAL FISTULOTOMY  Surgeon(s): Coralie Keens, MD  Anesthesia: General  Staff:  Circulator: Maurene Capes, RN Scrub Person: Izora Ribas, RN  Estimated Blood Loss: Minimal               Indications: This is a 47 year old gentleman who has had a lateral anal fistula for several years.  He now presents for fistulotomy  Findings: The patient had a chronic opening on the left perianal area.  It communicated directly with the anal canal and appeared to be superficial to the sphincter muscles.  Procedure: The patient was brought to the operating room and identified as the correct patient.  He was placed supine on the operating room table and general anesthesia was induced.  He was then placed in the lithotomy position.  His perianal area was prepped and draped in the usual sterile fashion.  There was a small opening with granulation tissue from the fistula tract on the left lateral perianal area.  I injected the perianal area with Exparel.  I tried to put a fistula probe in this but it would not go easily so I excised the overlying granulation tissue.  The tract could then be identified with the fistula probe and went directly to the anal canal.  It appeared to be above the sphincter muscles.  I opened up the fistula tract with the electrocautery cauterizing the granulation tissue.  At this point I then placed several pieces of Gelfoam into the wound and anal canal.  Hemostasis appeared to be achieved.  No other anal pathology was identified.  I injected further Exparel around the anal canal.  The wound still appeared to be oozing slightly so for hemostasis purposes, I placed several 3-0 chromic sutures.  Hemostasis then appeared to be achieved.  The patient tolerated the procedure well.  All the  counts were correct at the end of the procedure.  Gauze and mesh underwear were applied.  The patient tolerated the procedure well.  He was then extubated in the operating room and taken in a stable condition to the recovery room.          Vihan Santagata A   Date: 08/03/2017  Time: 11:00 AM

## 2017-08-03 NOTE — Anesthesia Procedure Notes (Signed)
Procedure Name: Intubation Date/Time: 08/03/2017 10:37 AM Performed by: Lieutenant Diego, CRNA Pre-anesthesia Checklist: Patient identified, Emergency Drugs available, Suction available and Patient being monitored Patient Re-evaluated:Patient Re-evaluated prior to induction Oxygen Delivery Method: Circle system utilized Preoxygenation: Pre-oxygenation with 100% oxygen Induction Type: IV induction Ventilation: Mask ventilation without difficulty Laryngoscope Size: Miller and 2 Grade View: Grade I Tube type: Oral Tube size: 8.0 mm Number of attempts: 1 Airway Equipment and Method: Stylet and Oral airway Placement Confirmation: ETT inserted through vocal cords under direct vision,  positive ETCO2 and breath sounds checked- equal and bilateral Secured at: 23 cm Tube secured with: Tape Dental Injury: Teeth and Oropharynx as per pre-operative assessment

## 2017-08-04 ENCOUNTER — Encounter (HOSPITAL_BASED_OUTPATIENT_CLINIC_OR_DEPARTMENT_OTHER): Payer: Self-pay | Admitting: Surgery

## 2017-08-04 NOTE — Anesthesia Postprocedure Evaluation (Signed)
Anesthesia Post Note  Patient: Jon Wade  Procedure(s) Performed: EXAM UNDER ANESTHESIA WITH ANAL FISTULOTOMY (N/A Anus)     Patient location during evaluation: PACU Anesthesia Type: General Level of consciousness: awake and alert Pain management: pain level controlled Vital Signs Assessment: post-procedure vital signs reviewed and stable Respiratory status: spontaneous breathing, nonlabored ventilation, respiratory function stable and patient connected to nasal cannula oxygen Cardiovascular status: blood pressure returned to baseline and stable Postop Assessment: no apparent nausea or vomiting Anesthetic complications: no    Last Vitals:  Vitals:   08/03/17 1200 08/03/17 1300  BP: 138/87 (!) 134/92  Pulse: (!) 55 (!) 50  Resp: 19 16  Temp:  37 C  SpO2: 100% 97%    Last Pain:  Vitals:   08/03/17 1300  TempSrc:   PainSc: 0-No pain   Pain Goal:                 Effie Berkshire

## 2017-08-08 ENCOUNTER — Encounter (HOSPITAL_BASED_OUTPATIENT_CLINIC_OR_DEPARTMENT_OTHER): Payer: Self-pay | Admitting: Surgery

## 2018-04-03 ENCOUNTER — Emergency Department (HOSPITAL_COMMUNITY)
Admission: EM | Admit: 2018-04-03 | Discharge: 2018-04-03 | Disposition: A | Discharge status: Home / Self Care | Payer: Self-pay | Attending: Emergency Medicine | Admitting: Emergency Medicine

## 2018-04-03 ENCOUNTER — Encounter (HOSPITAL_COMMUNITY): Payer: Self-pay | Admitting: *Deleted

## 2018-04-03 ENCOUNTER — Other Ambulatory Visit: Payer: Self-pay

## 2018-04-03 ENCOUNTER — Emergency Department (HOSPITAL_COMMUNITY): Payer: Self-pay

## 2018-04-03 DIAGNOSIS — R072 Precordial pain: Secondary | ICD-10-CM

## 2018-04-03 DIAGNOSIS — R079 Chest pain, unspecified: Secondary | ICD-10-CM | POA: Insufficient documentation

## 2018-04-03 LAB — BASIC METABOLIC PANEL
ANION GAP: 7 (ref 5–15)
BUN: 19 mg/dL (ref 6–20)
CALCIUM: 9.3 mg/dL (ref 8.9–10.3)
CHLORIDE: 106 mmol/L (ref 98–111)
CO2: 25 mmol/L (ref 22–32)
Creatinine, Ser: 0.9 mg/dL (ref 0.61–1.24)
GFR calc Af Amer: >60 mL/min (ref >60)
GFR calc non Af Amer: >60 mL/min (ref >60)
GLUCOSE: 150 mg/dL — AB (ref 70–99)
Potassium: 4.2 mmol/L (ref 3.5–5.1)
Sodium: 138 mmol/L (ref 135–145)

## 2018-04-03 LAB — I-STAT TROPONIN, ED: Troponin i, poc: 0 ng/mL (ref 0.00–0.08)

## 2018-04-03 LAB — CBC
HEMATOCRIT: 45.9 % (ref 39.0–52.0)
Hemoglobin: 14.6 g/dL (ref 13.0–17.0)
MCH: 30.4 pg (ref 26.0–34.0)
MCHC: 31.8 g/dL (ref 30.0–36.0)
MCV: 95.6 fL (ref 80.0–100.0)
PLATELETS: 226 10*3/uL (ref 150–400)
RBC: 4.8 MIL/uL (ref 4.22–5.81)
RDW: 12.5 % (ref 11.5–15.5)
WBC: 8.7 10*3/uL (ref 4.0–10.5)
nRBC: 0 % (ref 0.0–0.2)

## 2018-04-03 NOTE — ED Notes (Signed)
Pt stated that he needs to leave to go home and take care of elderly relative. Informed Emily - CN. Contacted Hope - NP to assess pt.

## 2018-04-03 NOTE — ED Provider Notes (Signed)
Patient placed in Quick Look pathway, seen and evaluated   Chief Complaint: chest pain  HPI: Jon Wade is a 47 y.o. male who presents to the ED with left side chest pain that started over a month ago and has gotten worse. Patient reports he now has nausea associated with the pain.  ROS: C/V: chest pain  GI: nausea Physical Exam:  BP (!) 140/92 (BP Location: Right Arm)   Pulse 90   Temp 98.2 F (36.8 C) (Oral)   Resp 20   SpO2 100%    Gen: No distress  Neuro: Awake and Alert  Skin: Warm and dry  Heart: regular rate and rhythm   Initiation of care has begun. The patient has been counseled on the process, plan, and necessity for staying for the completion/evaluation, and the remainder of the medical screening examination    Ashley [place]rain, NP 04/03/18 [place], Kevin, MD 04/03/18 [place], MD 04/04/18 [phone]

## 2018-04-03 NOTE — ED Triage Notes (Signed)
Pt in c/o left sided chest pain that has been going on for the last few days, report mild shortness of breath at times, no distress noted

## 2018-04-08 ENCOUNTER — Emergency Department (HOSPITAL_COMMUNITY): Payer: Self-pay

## 2018-04-08 ENCOUNTER — Encounter (HOSPITAL_COMMUNITY): Payer: Self-pay

## 2018-04-08 ENCOUNTER — Emergency Department (HOSPITAL_COMMUNITY)
Admission: EM | Admit: 2018-04-08 | Discharge: 2018-04-08 | Disposition: A | Discharge status: Home / Self Care | Payer: Self-pay | Attending: Emergency Medicine | Admitting: Emergency Medicine

## 2018-04-08 DIAGNOSIS — Z7982 Long term (current) use of aspirin: Secondary | ICD-10-CM | POA: Insufficient documentation

## 2018-04-08 DIAGNOSIS — Z79899 Other long term (current) drug therapy: Secondary | ICD-10-CM | POA: Insufficient documentation

## 2018-04-08 DIAGNOSIS — R1012 Left upper quadrant pain: Secondary | ICD-10-CM

## 2018-04-08 DIAGNOSIS — F1010 Alcohol abuse, uncomplicated: Secondary | ICD-10-CM | POA: Insufficient documentation

## 2018-04-08 DIAGNOSIS — F419 Anxiety disorder, unspecified: Secondary | ICD-10-CM | POA: Insufficient documentation

## 2018-04-08 DIAGNOSIS — K922 Gastrointestinal hemorrhage, unspecified: Secondary | ICD-10-CM | POA: Insufficient documentation

## 2018-04-08 DIAGNOSIS — F17210 Nicotine dependence, cigarettes, uncomplicated: Secondary | ICD-10-CM | POA: Insufficient documentation

## 2018-04-08 LAB — URINALYSIS, ROUTINE W REFLEX MICROSCOPIC
Bacteria, UA: NONE SEEN
Bilirubin Urine: NEGATIVE
Glucose, UA: NEGATIVE mg/dL
Ketones, ur: NEGATIVE mg/dL
Leukocytes, UA: NEGATIVE
Nitrite: NEGATIVE
Protein, ur: NEGATIVE mg/dL
Specific Gravity, Urine: 1.011 (ref 1.005–1.030)
pH: 6 (ref 5.0–8.0)

## 2018-04-08 LAB — COMPREHENSIVE METABOLIC PANEL
ALBUMIN: 4.1 g/dL (ref 3.5–5.0)
ALK PHOS: 57 U/L (ref 38–126)
ALT: 41 U/L (ref 0–44)
ANION GAP: 8 (ref 5–15)
AST: 28 U/L (ref 15–41)
BUN: 14 mg/dL (ref 6–20)
CALCIUM: 9.3 mg/dL (ref 8.9–10.3)
CHLORIDE: 104 mmol/L (ref 98–111)
CO2: 24 mmol/L (ref 22–32)
Creatinine, Ser: 0.93 mg/dL (ref 0.61–1.24)
GFR calc non Af Amer: >60 mL/min (ref >60)
GLUCOSE: 102 mg/dL — AB (ref 70–99)
Potassium: 3.8 mmol/L (ref 3.5–5.1)
SODIUM: 136 mmol/L (ref 135–145)
Total Bilirubin: 0.5 mg/dL (ref 0.3–1.2)
Total Protein: 7.2 g/dL (ref 6.5–8.1)

## 2018-04-08 LAB — CBC
HCT: 43.3 % (ref 39.0–52.0)
HEMOGLOBIN: 14.4 g/dL (ref 13.0–17.0)
MCH: 31.3 pg (ref 26.0–34.0)
MCHC: 33.3 g/dL (ref 30.0–36.0)
MCV: 94.1 fL (ref 80.0–100.0)
NRBC: 0 % (ref 0.0–0.2)
PLATELETS: 154 10*3/uL (ref 150–400)
RBC: 4.6 MIL/uL (ref 4.22–5.81)
RDW: 12.2 % (ref 11.5–15.5)
WBC: 6.6 10*3/uL (ref 4.0–10.5)

## 2018-04-08 LAB — TYPE AND SCREEN
ABO/RH(D): A POS
Antibody Screen: NEGATIVE

## 2018-04-08 LAB — POC OCCULT BLOOD, ED: FECAL OCCULT BLD: POSITIVE — AB

## 2018-04-08 LAB — LIPASE, BLOOD: Lipase: 35 U/L (ref 11–51)

## 2018-04-08 MED ORDER — OMEPRAZOLE 40 MG PO CPDR: 40.0000 mg | DELAYED_RELEASE_CAPSULE | Freq: Every day | ORAL | 0 refills | Status: DC

## 2018-04-08 MED ORDER — ONDANSETRON HCL 4 MG PO TABS: 4.0000 mg | ORAL_TABLET | Freq: Four times a day (QID) | ORAL | 0 refills | Status: DC

## 2018-04-08 MED ORDER — IOHEXOL 300 MG/ML  SOLN
100.0000 mL | Freq: Once | INTRAMUSCULAR | Status: AC | PRN
  Administered 2018-04-08: 100 mL via INTRAVENOUS

## 2018-04-08 NOTE — ED Triage Notes (Signed)
Onset 1 month pain underneath left breast and left side of abd and passing dark and bright red blood.  Was seen in ED  1 week ago for same.  Also c/o intermittant dizziness and short of breath.  Loose stools x 6 this morning.

## 2018-04-08 NOTE — ED Notes (Signed)
UA and UC sent together

## 2018-04-08 NOTE — ED Provider Notes (Signed)
[facility] EMERGENCY DEPARTMENT Provider Note   CSN: [id number] Arrival date & time: 04/08/18  1151     History   Chief Complaint Chief Complaint  Patient presents with  . GI Bleeding  . Abdominal Pain    HPI Jon Wade is a 47 y.o. male with history of stutter, anxiety, anal fistula who presents with a 28-month history of intermittent left upper abdominal pain extending into the left lower abdomen.  His pain seems to be worse in the morning.  He has had intermittent bloody stools.  He has also had intermittent nausea, but no vomiting.  He denies any fevers.  He has not had any symptoms like this in the past.  He reports having an anal fistula surgery little over a year ago.  He has a chronic, clear draining, small pustule that he has had for a long time.  He does not take any medications over-the-counter for his symptoms.  He denies any specific chest pain, but does say he has pain under his left breast.  He denies any shortness of breath, urinary symptoms.  He reports his pain occasionally radiates to his low back.  Patient drinks a sixpack of beer daily.  HPI  Past Medical History:  Diagnosis Date  . Anal fistula   . Anxiety   . Arthritis    "hands maybe" (11/26/2014)  . Heavy smoker    1ppd  . Stutter     Patient Active Problem List   Diagnosis Date Noted  . Perirectal abscess 11/26/2014  . Alcohol abuse 11/26/2014  . Sepsis ([place]) 11/26/2014  . Atypical chest pain 11/26/2014    Past Surgical History:  Procedure Laterality Date  . ANAL FISTULOTOMY N/A 08/03/2017   Procedure: EXAM UNDER ANESTHESIA WITH ANAL FISTULOTOMY;  Surgeon: Coralie Keens, MD;  Location: [facility];  Service: General;  Laterality: N/A;  . INCISION AND DRAINAGE ABSCESS Left 11/26/2014   buttocks  . IRRIGATION AND DEBRIDEMENT BUTTOCKS Left 11/26/2014   Procedure: Incision and Drainage of Left Buttock Abscess;  Surgeon: Donnie Mesa, MD;  Location: [facility];   Service: General;  Laterality: Left;  . KNEE ARTHROSCOPY Right 1988/1989        Home Medications    Prior to Admission medications   Medication Sig Start Date End Date Taking? Authorizing Provider  aspirin EC 81 MG tablet Take 81 mg by mouth daily.    [provider]  lidocaine (XYLOCAINE) 5 % ointment Apply 1 application topically as needed. 08/03/17   Coralie Keens, MD  omeprazole (PRILOSEC) 40 MG capsule Take 1 capsule (40 mg total) by mouth daily. 04/08/18   Kareemah Grounds, Bea Graff, PA-C  ondansetron (ZOFRAN) 4 MG tablet Take 1 tablet (4 mg total) by mouth every 6 (six) hours. 04/08/18   Gordon Vandunk, Bea Graff, PA-C  oxyCODONE (OXY IR/ROXICODONE) 5 MG immediate release tablet Take 1-2 tablets (5-10 mg total) by mouth every 6 (six) hours as needed for moderate pain, severe pain or breakthrough pain. 08/03/17   Coralie Keens, MD  Turmeric Curcumin 500 MG CAPS Take by mouth.    [provider]    Family History Family History  Problem Relation Age of Onset  . Diabetes Mellitus II Father     Social History Social History   Tobacco Use  . Smoking status: Current Every Day Smoker    Packs/day: 1.00    Years: 27.00    Pack years: 27.00    Types: Cigarettes  . Smokeless tobacco:  Never Used  Substance Use Topics  . Alcohol use: Yes    Alcohol/week: 7.0 standard drinks    Types: 7 Glasses of wine per week    Comment: 6-12-beers nightly  . Drug use: Yes    Types: Marijuana    Comment: smoked today 07-29-17, smokes daily     Allergies   Patient has no known allergies.   Review of Systems Review of Systems  Constitutional: Negative for chills and fever.  HENT: Negative for facial swelling and sore throat.   Respiratory: Negative for shortness of breath.   Cardiovascular: Negative for chest pain.  Gastrointestinal: Positive for abdominal pain, blood in stool, diarrhea and nausea. Negative for vomiting.  Genitourinary: Negative for dysuria.  Musculoskeletal:  Positive for back pain.  Skin: Negative for rash and wound.  Neurological: Negative for headaches.  Psychiatric/Behavioral: The patient is not nervous/anxious.      Physical Exam Updated Vital Signs BP 138/78   Pulse (!) 57   Temp 98.8 F (37.1 C) (Oral)   Resp 18   SpO2 99%   Physical Exam  Constitutional: He appears well-developed and well-nourished. No distress.  HENT:  Head: Normocephalic and atraumatic.  Mouth/Throat: Oropharynx is clear and moist. No oropharyngeal exudate.  Eyes: Pupils are equal, round, and reactive to light. Conjunctivae are normal. Right eye exhibits no discharge. Left eye exhibits no discharge. No scleral icterus.  Neck: Normal range of motion. Neck supple. No thyromegaly present.  Cardiovascular: Normal rate, regular rhythm, normal heart sounds and intact distal pulses. Exam reveals no gallop and no friction rub.  No murmur heard. Pulmonary/Chest: Effort normal and breath sounds normal. No stridor. No respiratory distress. He has no wheezes. He has no rales.  Abdominal: Soft. Bowel sounds are normal. He exhibits no distension. There is tenderness in the epigastric area, left upper quadrant and left lower quadrant. There is no rigidity, no rebound, no guarding, no CVA tenderness and no tenderness at McBurney's point.  Genitourinary: Rectal exam shows guaiac positive stool.  Genitourinary Comments: Scarring noted to anal area, no fistual noted, no active bleeding; there is a very small clear draining pustule on the L buttock without surrounding erythema  Musculoskeletal: He exhibits no edema.  Lymphadenopathy:    He has no cervical adenopathy.  Neurological: He is alert. Coordination normal.  Skin: Skin is warm and dry. No rash noted. He is not diaphoretic. No pallor.  Psychiatric: He has a normal mood and affect.  Nursing note and vitals reviewed.    ED Treatments / Results  Labs (all labs ordered are listed, but only abnormal results are  displayed) Labs Reviewed  COMPREHENSIVE METABOLIC PANEL - Abnormal; Notable for the following components:      Result Value   Glucose, Bld 102 (*)    All other components within normal limits  URINALYSIS, ROUTINE W REFLEX MICROSCOPIC - Abnormal; Notable for the following components:   Hgb urine dipstick SMALL (*)    All other components within normal limits  POC OCCULT BLOOD, ED - Abnormal; Notable for the following components:   Fecal Occult Bld POSITIVE (*)    All other components within normal limits  CBC  LIPASE, BLOOD  TYPE AND SCREEN    EKG EKG Interpretation  Date/Time:  Saturday April 08 2018 12:03:26 EST Ventricular Rate:  69 PR Interval:    QRS Duration: 108 QT Interval:  418 QTC Calculation: 448 R Axis:   -42 Text Interpretation:  Sinus rhythm Left axis deviation ST elev, probable  normal early repol pattern similar to prior 11/19 Confirmed by Aletta Edouard [phone]) on 04/08/2018 12:15:57 PM   Radiology Ct Abdomen Pelvis W Contrast  Result Date: 04/08/2018 CLINICAL DATA:  Left-sided abdominal pain. EXAM: CT ABDOMEN AND PELVIS WITH CONTRAST TECHNIQUE: Multidetector CT imaging of the abdomen and pelvis was performed using the standard protocol following bolus administration of intravenous contrast. CONTRAST:  152mL OMNIPAQUE IOHEXOL 300 MG/ML  SOLN COMPARISON:  CT scan of November 26, 2014. FINDINGS: Lower chest: No acute abnormality. Hepatobiliary: No focal liver abnormality is seen. No gallstones, gallbladder wall thickening, or biliary dilatation. Pancreas: Unremarkable. No pancreatic ductal dilatation or surrounding inflammatory changes. Spleen: Normal in size without focal abnormality. Adrenals/Urinary Tract: Adrenal glands are unremarkable. Kidneys are normal, without renal calculi, focal lesion, or hydronephrosis. Bladder is unremarkable. Stomach/Bowel: Stomach is within normal limits. Appendix appears normal. No evidence of bowel wall thickening, distention, or  inflammatory changes. Vascular/Lymphatic: No significant vascular findings are present. No enlarged abdominal or pelvic lymph nodes. Reproductive: Prostate is unremarkable. Other: No abdominal wall hernia or abnormality. No abdominopelvic ascites. Musculoskeletal: No acute or significant osseous findings. IMPRESSION: No abnormality seen in the abdomen or pelvis. Electronically Signed   By: Marijo Conception, M.D.   On: 04/08/2018 14:11    Procedures Procedures (including critical care time)  Medications Ordered in ED Medications  iohexol (OMNIPAQUE) 300 MG/ML solution 100 mL (100 mLs Intravenous Contrast Given 04/08/18 1328)     Initial Impression / Assessment and Plan / ED Course  I have reviewed the triage vital signs and the nursing notes.  Pertinent labs & imaging results that were available during my care of the patient were reviewed by me and considered in my medical decision making (see chart for details).     Patient suspected to have slow upper GI bleed, probably ulcer.  He is well-appearing with stable vitals.  Labs are unremarkable, including hemoglobin, which is 14.4.  Fecal occult positive.  CT abdomen pelvis is negative, especially for diverticulitis.  Patient advised to cut back on alcohol and avoid acidic foods.  Will initiate PPI and Zofran for symptomatic treatment.  Patient will be referred to gastroenterology for further evaluation and treatment.  Patient understands and agrees with plan.  Patient vitals stable throughout ED course and discharged in satisfactory condition. I discussed patient case with Dr. Melina Copa who guided the patient's management and agrees with plan.   Final Clinical Impressions(s) / ED Diagnoses   Final diagnoses:  Left upper quadrant pain  Acute GI bleeding    ED Discharge Orders         Ordered    omeprazole (PRILOSEC) 40 MG capsule  Daily     04/08/18 1431    ondansetron (ZOFRAN) 4 MG tablet  Every 6 hours     04/08/18 1431            Luverne Zerkle, [place], PA-C 04/08/18 1514    Hayden Rasmussen, MD 04/09/18 [phone]

## 2018-04-08 NOTE — Discharge Instructions (Addendum)
Begin taking Prilosec once daily as prescribed.  Take Zofran every 6 hours as needed for nausea or vomiting.  You most likely have a slowly bleeding ulcer.  It is important to try to cut back on your alcohol use.  Avoid acidic foods as well as NSAIDs such as ibuprofen, Aleve, and aspirin.  Please follow-up with gastroenterology for further evaluation and probable endoscopy and colonoscopy.  Please return to the emergency department if you develop any new or worsening symptoms including fever, severe worsening pain, significant increase in your bleeding, passing out, or any other concerning symptoms.

## 2020-02-25 ENCOUNTER — Ambulatory Visit (HOSPITAL_COMMUNITY)
Admission: EM | Admit: 2020-02-25 | Discharge: 2020-02-25 | Disposition: A | Discharge status: Home / Self Care | Payer: Self-pay | Attending: Emergency Medicine | Admitting: Emergency Medicine

## 2020-02-25 ENCOUNTER — Encounter (HOSPITAL_COMMUNITY): Payer: Self-pay

## 2020-02-25 ENCOUNTER — Other Ambulatory Visit: Payer: Self-pay

## 2020-02-25 DIAGNOSIS — M79669 Pain in unspecified lower leg: Secondary | ICD-10-CM

## 2020-02-25 DIAGNOSIS — Z23 Encounter for immunization: Secondary | ICD-10-CM

## 2020-02-25 DIAGNOSIS — S61412A Laceration without foreign body of left hand, initial encounter: Secondary | ICD-10-CM

## 2020-02-25 DIAGNOSIS — I872 Venous insufficiency (chronic) (peripheral): Secondary | ICD-10-CM

## 2020-02-25 DIAGNOSIS — R109 Unspecified abdominal pain: Secondary | ICD-10-CM

## 2020-02-25 DIAGNOSIS — K2100 Gastro-esophageal reflux disease with esophagitis, without bleeding: Secondary | ICD-10-CM

## 2020-02-25 LAB — POCT URINALYSIS DIPSTICK, ED / UC
Bilirubin Urine: NEGATIVE
Glucose, UA: NEGATIVE mg/dL
Ketones, ur: NEGATIVE mg/dL
Leukocytes,Ua: NEGATIVE
Nitrite: NEGATIVE
Protein, ur: NEGATIVE mg/dL
Specific Gravity, Urine: >=1.03 (ref 1.005–1.030)
Urobilinogen, UA: 0.2 mg/dL (ref 0.0–1.0)
pH: 5.5 (ref 5.0–8.0)

## 2020-02-25 MED ORDER — TETANUS-DIPHTH-ACELL PERTUSSIS 5-2.5-18.5 LF-MCG/0.5 IM SUSP
0.5000 mL | Freq: Once | INTRAMUSCULAR | Status: AC
  Administered 2020-02-25: 0.5 mL via INTRAMUSCULAR

## 2020-02-25 MED ORDER — TETANUS-DIPHTH-ACELL PERTUSSIS 5-2.5-18.5 LF-MCG/0.5 IM SUSP
INTRAMUSCULAR | Status: AC
  Filled 2020-02-25: qty 0.5

## 2020-02-25 MED ORDER — OMEPRAZOLE 40 MG PO CPDR: 40.0000 mg | DELAYED_RELEASE_CAPSULE | Freq: Every day | ORAL | 0 refills | Status: DC

## 2020-02-25 NOTE — ED Provider Notes (Signed)
[place]    CSN: [id number] Arrival date & time: 02/25/20  1543      History   Chief Complaint Chief Complaint  Patient presents with  . Abdominal Pain    x 3 months  . Leg Pain    x 2 months    HPI Jon Wade is a 49 y.o. male.   Pt complains of LUQ pain that has been going on for several months.  Reports often worse after eating spicy foods.  He denies n/v, blood in stool.  He has been seen for this problem in the past and prescribed PPI.  He reports he is no longer taking this, doesn't remember if it helped.  He has also been referred to GI in the past, but did not follow up.  He reports urine has been very dark lately, foul smelling.  Denies hematuria, dysuria, lower abdominal pain, flank pain, fever/chills. He also complains of bilateral lower extremity swelling and pain.  Reports this has been going on intermittently for the last two months.  Denies chest pain, shortness of breath, palpitations.  He reports today the right lower extremity is worse than the left.  He does not have a PCP.   Pt cut his thumb last night on a sand blaster.  Reports last tetanus greater than 10 years ago.       Past Medical History:  Diagnosis Date  . Anal fistula   . Anxiety   . Arthritis    "hands maybe" (11/26/2014)  . Heavy smoker    1ppd  . Stutter     Patient Active Problem List   Diagnosis Date Noted  . Perirectal abscess 11/26/2014  . Alcohol abuse 11/26/2014  . Sepsis ([place]) 11/26/2014  . Atypical chest pain 11/26/2014    Past Surgical History:  Procedure Laterality Date  . ANAL FISTULOTOMY N/A 08/03/2017   Procedure: EXAM UNDER ANESTHESIA WITH ANAL FISTULOTOMY;  Surgeon: Coralie Keens, MD;  Location: [facility];  Service: General;  Laterality: N/A;  . INCISION AND DRAINAGE ABSCESS Left 11/26/2014   buttocks  . IRRIGATION AND DEBRIDEMENT BUTTOCKS Left 11/26/2014   Procedure: Incision and Drainage of Left Buttock Abscess;  Surgeon: Donnie Mesa, MD;  Location: [facility];  Service: General;  Laterality: Left;  . KNEE ARTHROSCOPY Right 1988/1989       Home Medications    Prior to Admission medications   Medication Sig Start Date End Date Taking? Authorizing Provider  aspirin EC 81 MG tablet Take 81 mg by mouth daily.   Yes [provider]  lidocaine (XYLOCAINE) 5 % ointment Apply 1 application topically as needed. 08/03/17   Coralie Keens, MD  omeprazole (PRILOSEC) 40 MG capsule Take 1 capsule (40 mg total) by mouth daily. 04/08/18   Law, Bea Graff, PA-C  ondansetron (ZOFRAN) 4 MG tablet Take 1 tablet (4 mg total) by mouth every 6 (six) hours. 04/08/18   Law, Bea Graff, PA-C  oxyCODONE (OXY IR/ROXICODONE) 5 MG immediate release tablet Take 1-2 tablets (5-10 mg total) by mouth every 6 (six) hours as needed for moderate pain, severe pain or breakthrough pain. 08/03/17   Coralie Keens, MD  Turmeric Curcumin 500 MG CAPS Take by mouth.    [provider]    Family History Family History  Problem Relation Age of Onset  . Diabetes Mellitus II Father     Social History Social History   Tobacco Use  . Smoking status: Current Every Day Smoker  Packs/day: 1.00    Years: 27.00    Pack years: 27.00    Types: Cigarettes  . Smokeless tobacco: Never Used  Vaping Use  . Vaping Use: Never used  Substance Use Topics  . Alcohol use: Yes    Alcohol/week: 7.0 standard drinks    Types: 7 Glasses of wine per week    Comment: 6-12-beers nightly  . Drug use: Yes    Types: Marijuana    Comment: smoked today 07-29-17, smokes daily     Allergies   Patient has no known allergies.   Review of Systems Review of Systems  Constitutional: Negative for chills and fever.  HENT: Negative for ear pain and sore throat.   Eyes: Negative for pain and visual disturbance.  Respiratory: Negative for cough and shortness of breath.   Cardiovascular: Positive for leg swelling (bilateral ). Negative for chest pain and  palpitations.  Gastrointestinal: Positive for abdominal pain (LUQ pain). Negative for abdominal distention, anal bleeding, blood in stool, constipation, diarrhea, nausea, rectal pain and vomiting.  Genitourinary: Negative for decreased urine volume, dysuria, flank pain, frequency, hematuria and urgency.       Dark urine  Musculoskeletal: Negative for arthralgias and back pain.  Skin: Negative for color change and rash.  Neurological: Negative for seizures and syncope.  All other systems reviewed and are negative.    Physical Exam Triage Vital Signs ED Triage Vitals  Enc Vitals Group     BP 02/25/20 1651 137/81     Pulse Rate 02/25/20 1651 75     Resp 02/25/20 1651 18     Temp 02/25/20 1651 98.1 F (36.7 C)     Temp Source 02/25/20 1651 Oral     SpO2 02/25/20 1651 98 %     Weight --      Height --      Head Circumference --      Peak Flow --      Pain Score 02/25/20 1653 10     Pain Loc --      Pain Edu? --      Excl. in [place]? --    No data found.  Updated Vital Signs BP 137/81 (BP Location: Right Arm)   Pulse 75   Temp 98.1 F (36.7 C) (Oral)   Resp 18   SpO2 98%   Visual Acuity Right Eye Distance:   Left Eye Distance:   Bilateral Distance:    Right Eye Near:   Left Eye Near:    Bilateral Near:     Physical Exam Vitals and nursing note reviewed.  Constitutional:      Appearance: He is well-developed.  HENT:     Head: Normocephalic and atraumatic.  Eyes:     Conjunctiva/sclera: Conjunctivae normal.  Cardiovascular:     Rate and Rhythm: Normal rate and regular rhythm.     Heart sounds: No murmur heard.   Pulmonary:     Effort: Pulmonary effort is normal. No respiratory distress.     Breath sounds: Normal breath sounds.  Abdominal:     Palpations: Abdomen is soft.     Tenderness: There is abdominal tenderness (mild epigastric tenderness) in the epigastric area.  Musculoskeletal:     Cervical back: Neck supple.     Right lower leg: No tenderness. 2+  Edema present.     Left lower leg: No tenderness. 1+ Edema present.  Skin:    General: Skin is warm and dry.  Neurological:     Mental Status: He  is alert.      UC Treatments / Results  Labs (all labs ordered are listed, but only abnormal results are displayed) Labs Reviewed - No data to display  EKG   Radiology No results found.  Procedures Procedures (including critical care time)  Medications Ordered in UC Medications - No data to display  Initial Impression / Assessment and Plan / UC Course  I have reviewed the triage vital signs and the nursing notes.  Pertinent labs & imaging results that were available during my care of the patient were reviewed by me and considered in my medical decision making (see chart for details).     Pt presents with epigastric pain.  Denies chest pain, shortness of breath. EKG unchanged. Reports epigastric pain is worse after eating spicy foods.  Most likely peptic ulcer, previously dx with this.  Will prescribe PPI today.  Diet modifications discussed.  Lower extremity edema likely chronic venous insufficiency.  No erythema, tenderness, homans sign. Recommend compression stockings.  Pt with laceration to the left thumb that occurred yesterday on a sand blaster, occurred greater than 24 hours ago at this point.  Last tetanus greater than 1- years ago, tetanus administered here today.  Advised follow up with PCP regarding these chronic complaints.  Return precautions discussed.  Final Clinical Impressions(s) / UC Diagnoses   Final diagnoses:  None   Discharge Instructions   None    ED Prescriptions    None     PDMP not reviewed this encounter.   Konrad Felix, PA-C 02/25/20 1808

## 2020-02-25 NOTE — ED Triage Notes (Signed)
Pt states he has been experiencing midline/RUQ abdominal pain for approximately 3 months. Pt states his leg has been painful/feeling pressure x 2 months. Patient has right leg pitting edema. Pt is a poor historian and states is unsure of previous medical diagnoses or conditions. Pt is aox4 and ambulates with a slight limp.

## 2020-02-25 NOTE — Discharge Instructions (Signed)
Take medications as prescribed Recommend compression stockings and follow up with primary care physician  Watch laceration for signs of infection.  Return if you notice redness, swelling, discharge, fever, chills.

## 2020-06-16 ENCOUNTER — Other Ambulatory Visit: Payer: Self-pay

## 2020-06-16 ENCOUNTER — Encounter (HOSPITAL_COMMUNITY): Payer: Self-pay | Admitting: *Deleted

## 2020-06-16 ENCOUNTER — Emergency Department (HOSPITAL_COMMUNITY)
Admission: EM | Admit: 2020-06-16 | Discharge: 2020-06-16 | Disposition: A | Discharge status: Home / Self Care | Payer: Self-pay | Attending: Emergency Medicine | Admitting: Emergency Medicine

## 2020-06-16 DIAGNOSIS — Z7982 Long term (current) use of aspirin: Secondary | ICD-10-CM | POA: Insufficient documentation

## 2020-06-16 DIAGNOSIS — K0889 Other specified disorders of teeth and supporting structures: Secondary | ICD-10-CM | POA: Insufficient documentation

## 2020-06-16 DIAGNOSIS — F17210 Nicotine dependence, cigarettes, uncomplicated: Secondary | ICD-10-CM | POA: Insufficient documentation

## 2020-06-16 MED ORDER — HYDROCODONE-ACETAMINOPHEN 5-325 MG PO TABS: 1.0000 | ORAL_TABLET | Freq: Four times a day (QID) | ORAL | 0 refills | Status: DC | PRN

## 2020-06-16 MED ORDER — CLINDAMYCIN HCL 150 MG PO CAPS: 450.0000 mg | ORAL_CAPSULE | Freq: Three times a day (TID) | ORAL | 0 refills | Status: AC

## 2020-06-16 MED ORDER — CLINDAMYCIN HCL 300 MG PO CAPS
450.0000 mg | ORAL_CAPSULE | Freq: Once | ORAL | Status: AC
  Administered 2020-06-16: 450 mg via ORAL
  Filled 2020-06-16: qty 1

## 2020-06-16 NOTE — ED Provider Notes (Signed)
[name]   CSN: [id number] Arrival date & time: 06/16/20  0932     History Chief Complaint  Patient presents with  . Dental Pain    Jon Wade is a 50 y.o. male with past medical history significant for tobacco abuse, anal fistula, arthritis.  HPI Patient presents to emergency department today with chief complaint of dental pain x2 days.  Pain is located in his left lower jaw.  Patient states he has had a broken tooth for several months, maybe even a year.  He has not seen a dentist in at least 1 year.  He states the pain has been progressively worsening.  He describes the pain as throbbing sensation.  He rates pain 10 out of 10 in severity.  Pain is localized to the tooth.  He noticed facial swelling last night.  He has been taking naproxen for the pain and applying Orajel with only minimal symptom relief. Denies fever, chills, voice change, inability to control secretions, nausea/vomiting, dysphagia, odynophagia, drainage or trauma     Past Medical History:  Diagnosis Date  . Anal fistula   . Anxiety   . Arthritis    "hands maybe" (11/26/2014)  . Heavy smoker    1ppd  . Stutter     Patient Active Problem List   Diagnosis Date Noted  . Perirectal abscess 11/26/2014  . Alcohol abuse 11/26/2014  . Sepsis ([place]) 11/26/2014  . Atypical chest pain 11/26/2014    Past Surgical History:  Procedure Laterality Date  . ANAL FISTULOTOMY N/A 08/03/2017   Procedure: EXAM UNDER ANESTHESIA WITH ANAL FISTULOTOMY;  Surgeon: Coralie Keens, MD;  Location: [facility];  Service: General;  Laterality: N/A;  . INCISION AND DRAINAGE ABSCESS Left 11/26/2014   buttocks  . IRRIGATION AND DEBRIDEMENT BUTTOCKS Left 11/26/2014   Procedure: Incision and Drainage of Left Buttock Abscess;  Surgeon: Donnie Mesa, MD;  Location: [facility];  Service: General;  Laterality: Left;  . KNEE ARTHROSCOPY Right 1988/1989       Family  History  Problem Relation Age of Onset  . Diabetes Mellitus II Father     Social History   Tobacco Use  . Smoking status: Current Every Day Smoker    Packs/day: 1.00    Years: 27.00    Pack years: 27.00    Types: Cigarettes  . Smokeless tobacco: Never Used  Vaping Use  . Vaping Use: Never used  Substance Use Topics  . Alcohol use: Yes    Alcohol/week: 7.0 standard drinks    Types: 7 Glasses of wine per week    Comment: 6-12-beers nightly  . Drug use: Yes    Types: Marijuana    Comment: smoked today 07-29-17, smokes daily    Home Medications Prior to Admission medications   Medication Sig Start Date End Date Taking? Authorizing Provider  aspirin EC 81 MG tablet Take 81 mg by mouth daily.    [provider]  lidocaine (XYLOCAINE) 5 % ointment Apply 1 application topically as needed. 08/03/17   Coralie Keens, MD  omeprazole (PRILOSEC) 40 MG capsule Take 1 capsule (40 mg total) by mouth daily. 02/25/20   Zanetto, Janett Billow, PA-C  ondansetron (ZOFRAN) 4 MG tablet Take 1 tablet (4 mg total) by mouth every 6 (six) hours. 04/08/18   Law, Bea Graff, PA-C  oxyCODONE (OXY IR/ROXICODONE) 5 MG immediate release tablet Take 1-2 tablets (5-10 mg total) by mouth every 6 (six) hours as needed for moderate  pain, severe pain or breakthrough pain. 08/03/17   Coralie Keens, MD  Turmeric Curcumin 500 MG CAPS Take by mouth.    [provider]    Allergies    Patient has no known allergies.  Review of Systems   Review of Systems All other systems are reviewed and are negative for acute change except as noted in the HPI.  Physical Exam Updated Vital Signs BP (!) 151/87 (BP Location: Right Arm)   Pulse 69   Temp 98.4 F (36.9 C) (Oral)   Resp 16   Ht 5' 9.5" (1.765 m)   Wt 124.7 kg   SpO2 97%   BMI 40.03 kg/m   Physical Exam Vitals and nursing note reviewed.  Constitutional:      General: He is not in acute distress.    Appearance: He is not toxic-appearing.   HENT:     Head: Normocephalic and atraumatic.     Nose: Nose normal.     Mouth/Throat:      Comments: Poor dentition overall. No trismus. Mouth opening to at least 3 finger widths. Handles oral secretions without difficulty. External exam shows mild swelling on left cheek  No swelling or tenderness to the submental or submandibular regions. No swelling or tenderness into the soft tissues of the neck.Full active range of motion of the jaw. Neck is supple with full active range of motion, no tenderness to palpation of the soft tissues.   Gumline palpated no obvious signs of infection including no warmth, redness, abscess, tenderness. Posterior oropharynx clear with no signs of infection, uvula is midline and rises with phonation.   Eyes:     General: No scleral icterus.       Right eye: No discharge.        Left eye: No discharge.     Conjunctiva/sclera: Conjunctivae normal.  Cardiovascular:     Rate and Rhythm: Normal rate and regular rhythm.     Pulses: Normal pulses.     Heart sounds: Normal heart sounds.  Pulmonary:     Effort: Pulmonary effort is normal.     Breath sounds: Normal breath sounds.  Abdominal:     General: There is no distension.  Musculoskeletal:        General: Normal range of motion.     Cervical back: Normal range of motion. No muscular tenderness.  Skin:    General: Skin is warm and dry.  Neurological:     Mental Status: He is oriented to person, place, and time.  Psychiatric:        Behavior: Behavior normal.     ED Results / Procedures / Treatments   Labs (all labs ordered are listed, but only abnormal results are displayed) Labs Reviewed - No data to display  EKG None  Radiology No results found.  Procedures Procedures (including critical care time)  Medications Ordered in ED Medications - No data to display  ED Course  I have reviewed the triage vital signs and the nursing notes.  Pertinent labs & imaging results that were  available during my care of the patient were reviewed by me and considered in my medical decision making (see chart for details).    MDM Rules/Calculators/A&P                          History provided by patient with additional history obtained from chart review.    Pt is well appearing and is presenting with dental pain.  Differential Diagnosis includes but is not limited to: toothache, abscess, periapical abscess, dental caries, cracked tooth, maxillary sinusitis, gingivitis, gum hyperplasia, tooth fracture, tooth dislocation. On exam patient has chronic dental decay. Patient with toothache.  No gross abscess.  Exam unconcerning for Ludwig's angina or spread of infection.  Will treat with clindamycin. He already has prescription for NSAID.  Urged patient to follow-up with dentist and given dental resource list. VSS.  I have reviewed the PDMP during this encounter.  Patient unable to have narcotic pain medicine as he drove today, will send for short course of Norco to his pharmacy.  He has no active or recent narcotic prescriptions. Pt appears stable for d/c. Strict return precautions discussed.    Portions of this note were generated with [employer]. Dictation errors may occur despite best attempts at proofreading.   Final Clinical Impression(s) / ED Diagnoses Final diagnoses:  None    Rx / DC Orders ED Discharge Orders    None       Lewanda Rife 06/16/20 1053    Breck Coons, MD 06/16/20 1137

## 2020-06-16 NOTE — Discharge Instructions (Signed)
Today you were seen for dental pain.  If you start to experience and new or worsening symptoms return to the emergency department. If you start to experience fever, chills, neck stiffness/pain, or inability to move your neck or open your mouth come back to the emergency department immediately. If you begin to experience any blistering, rashes, swelling, or difficulty breathing seek medical care for evaluation of potentially more serious side effects.  Medications:  -I have prescribed you an antibiotic  clindamycin to treat the infection.  -You can also gargle warm salt water up to 5 times daily.  Both of these rinses can help to remove bacteria from the mouth.  You can also use viscous lidocaine every 3 hours to help numb the area.  You can apply a cool compress for 15 to 20 minutes, but avoid applying heat to the area. -Continue usual home medications.  2. Follow Up: On-call dentist today is Dr. Haig Prophet.  Call his office today to schedule the next available appointment.  With it being a holiday the office may not be open so you will need to call tomorrow if no one is here today.  -I have also given you information for the [facility].  You can follow-up with their clinic.  Call the number in your discharge paperwork to schedule next available appointment.  This is a primary care doctor.  It is very important that you get evaluated by a dentist as soon as possible. Call tomorrow to schedule an appointment. Read the instructions below.    Be sure to eat something when taking the Naproxen as it can cause stomach upset and at worst stomach bleeding. Do not take additional non steroidal anti-inflammatory medicines such as Ibuprofen, Aleve, Advil, Mobic, Diclofenac, or goodie powder while taking Naproxen. You may supplement with Tylenol.

## 2020-06-16 NOTE — ED Triage Notes (Signed)
Pt has left lower side dental pain since yesterday, swelling noted. Airway intact.

## 2020-06-16 NOTE — ED Notes (Signed)
PA at bedside to answer questions about pain management. Plan of care discussed with discharge paperwork

## 2021-04-24 ENCOUNTER — Other Ambulatory Visit: Payer: Self-pay

## 2021-04-24 ENCOUNTER — Emergency Department (HOSPITAL_COMMUNITY)
Admission: EM | Admit: 2021-04-24 | Discharge: 2021-04-24 | Disposition: A | Discharge status: Home / Self Care | Payer: Self-pay | Attending: Emergency Medicine | Admitting: Emergency Medicine

## 2021-04-24 ENCOUNTER — Encounter (HOSPITAL_COMMUNITY): Payer: Self-pay

## 2021-04-24 ENCOUNTER — Emergency Department (HOSPITAL_COMMUNITY): Payer: Self-pay

## 2021-04-24 DIAGNOSIS — F17210 Nicotine dependence, cigarettes, uncomplicated: Secondary | ICD-10-CM | POA: Insufficient documentation

## 2021-04-24 DIAGNOSIS — Y900 Blood alcohol level of less than 20 mg/100 ml: Secondary | ICD-10-CM | POA: Insufficient documentation

## 2021-04-24 DIAGNOSIS — Z79899 Other long term (current) drug therapy: Secondary | ICD-10-CM | POA: Insufficient documentation

## 2021-04-24 DIAGNOSIS — K625 Hemorrhage of anus and rectum: Secondary | ICD-10-CM

## 2021-04-24 DIAGNOSIS — K2901 Acute gastritis with bleeding: Secondary | ICD-10-CM | POA: Insufficient documentation

## 2021-04-24 DIAGNOSIS — Z7982 Long term (current) use of aspirin: Secondary | ICD-10-CM | POA: Insufficient documentation

## 2021-04-24 DIAGNOSIS — K2900 Acute gastritis without bleeding: Secondary | ICD-10-CM

## 2021-04-24 LAB — URINALYSIS, ROUTINE W REFLEX MICROSCOPIC
Bilirubin Urine: NEGATIVE
Glucose, UA: NEGATIVE mg/dL
Hgb urine dipstick: NEGATIVE
Ketones, ur: NEGATIVE mg/dL
Leukocytes,Ua: NEGATIVE
Nitrite: NEGATIVE
Protein, ur: NEGATIVE mg/dL
Specific Gravity, Urine: 1.016 (ref 1.005–1.030)
pH: 5 (ref 5.0–8.0)

## 2021-04-24 LAB — COMPREHENSIVE METABOLIC PANEL
ALT: 39 U/L (ref 0–44)
AST: 29 U/L (ref 15–41)
Albumin: 3.9 g/dL (ref 3.5–5.0)
Alkaline Phosphatase: 59 U/L (ref 38–126)
Anion gap: 11 (ref 5–15)
BUN: 15 mg/dL (ref 6–20)
CO2: 26 mmol/L (ref 22–32)
Calcium: 9.1 mg/dL (ref 8.9–10.3)
Chloride: 99 mmol/L (ref 98–111)
Creatinine, Ser: 0.84 mg/dL (ref 0.61–1.24)
GFR, Estimated: >60 mL/min (ref >60)
Glucose, Bld: 205 mg/dL — ABNORMAL HIGH (ref 70–99)
Potassium: 4.1 mmol/L (ref 3.5–5.1)
Sodium: 136 mmol/L (ref 135–145)
Total Bilirubin: 0.9 mg/dL (ref 0.3–1.2)
Total Protein: 7 g/dL (ref 6.5–8.1)

## 2021-04-24 LAB — CBC WITH DIFFERENTIAL/PLATELET
Abs Immature Granulocytes: 0.03 10*3/uL (ref 0.00–0.07)
Basophils Absolute: 0 10*3/uL (ref 0.0–0.1)
Basophils Relative: 1 %
Eosinophils Absolute: 0.1 10*3/uL (ref 0.0–0.5)
Eosinophils Relative: 1 %
HCT: 42.6 % (ref 39.0–52.0)
Hemoglobin: 14.7 g/dL (ref 13.0–17.0)
Immature Granulocytes: 0 %
Lymphocytes Relative: 24 %
Lymphs Abs: 1.9 10*3/uL (ref 0.7–4.0)
MCH: 32.7 pg (ref 26.0–34.0)
MCHC: 34.5 g/dL (ref 30.0–36.0)
MCV: 94.9 fL (ref 80.0–100.0)
Monocytes Absolute: 0.2 10*3/uL (ref 0.1–1.0)
Monocytes Relative: 3 %
Neutro Abs: 5.5 10*3/uL (ref 1.7–7.7)
Neutrophils Relative %: 71 %
Platelets: 199 10*3/uL (ref 150–400)
RBC: 4.49 MIL/uL (ref 4.22–5.81)
RDW: 12.7 % (ref 11.5–15.5)
WBC: 7.8 10*3/uL (ref 4.0–10.5)
nRBC: 0 % (ref 0.0–0.2)

## 2021-04-24 LAB — POC OCCULT BLOOD, ED: Fecal Occult Bld: POSITIVE — AB

## 2021-04-24 LAB — LIPASE, BLOOD: Lipase: 48 U/L (ref 11–51)

## 2021-04-24 LAB — ETHANOL: Alcohol, Ethyl (B): <10 mg/dL (ref <10)

## 2021-04-24 MED ORDER — OMEPRAZOLE 20 MG PO CPDR: 20.0000 mg | DELAYED_RELEASE_CAPSULE | Freq: Every day | ORAL | 0 refills | Status: DC

## 2021-04-24 MED ORDER — IOHEXOL 300 MG/ML  SOLN
100.0000 mL | Freq: Once | INTRAMUSCULAR | Status: AC | PRN
  Administered 2021-04-24: 100 mL via INTRAVENOUS

## 2021-04-24 MED ORDER — LACTATED RINGERS IV SOLN: INTRAVENOUS | Status: DC

## 2021-04-24 MED ORDER — HYDROCORTISONE ACETATE 25 MG RE SUPP: 25.0000 mg | Freq: Two times a day (BID) | RECTAL | 0 refills | Status: DC

## 2021-04-24 NOTE — Care Management (Incomplete)
Patient does not have a PCP or health Insurance. Patient presented with rectal bleeding.  ED RNCM will sent referral to [facility]

## 2021-04-24 NOTE — ED Provider Notes (Signed)
Emergency Medicine Provider Triage Evaluation Note  Jon Wade , a 50 y.o. male  was evaluated in triage.  Pt complains of passing bright red blood with clots in the stool x2 weeks.  Associated with left upper quadrant pain that sometimes radiates to the right upper quadrant.  Nausea but no vomiting.  No history of the same..  Review of Systems  Positive: Watery stool, hematochezia abdominal pain, nausea, chills Negative: Vomiting, fevers, chest pain, shortness of breath  Physical Exam  BP (!) 146/75 (BP Location: Right Arm)   Pulse 85   Temp 98.6 F (37 C)   Resp 18   Ht 5' 9.5" (1.765 m)   Wt 125.2 kg   SpO2 96%   BMI 40.17 kg/m  Gen:   Awake, no distress   Resp:  Normal effort  MSK:   Moves extremities without difficulty  Other:  Abdomen tender in the left upper and right upper quadrant as well as the epigastrium.  Otherwise unremarkable.  No CVAT.  Medical Decision Making  Medically screening exam initiated at 3:03 PM.  Appropriate orders placed.  [facility] was informed that the remainder of the evaluation will be completed by another provider, this initial triage assessment does not replace that evaluation, and the importance of remaining in the ED until their evaluation is complete.  This chart was dictated using voice recognition software, Dragon. Despite the best efforts of this provider to proofread and correct errors, errors may still occur which can change documentation meaning.    Emeline Darling, PA-C 04/24/21 1512    Wynona Dove A, DO 04/25/21 1603

## 2021-04-24 NOTE — ED Triage Notes (Signed)
Pt reports bright red rectal bleeding with clots for the past 2 weeks. Pt reports associated LUQ pain. Pt denies taking a blood thinner. Nausea but no vomiting. Some dizziness in the morning

## 2021-04-24 NOTE — ED Provider Notes (Signed)
[facility] EMERGENCY DEPARTMENT Provider Note   CSN: [id number] Arrival date & time: 04/24/21  1347     History Chief Complaint  Patient presents with   Rectal Bleeding    Jon Wade is a 50 y.o. male.  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  Patient reports the left upper quadrant abdominal pain has been gradually getting worse.  It initially was intermittent but now is constant.  It does not radiate and is an aching type of pain.  It is worse with eating but never goes away.  He has not really tried any medication to make it better.  He denies any aspirin, NSAID, Goody powder or BC powder use.  No prior history of ulcers.  He still is drinking approximately a sixpack nightly but reports if he does not have alcohol he does not go through withdrawal.  Also in the last 2 weeks he started having bright red rectal bleeding.  Sometimes he reports he only passes blood but sometimes it is also mixed with his stool which can be very soft and runny at times but he has never noticed any black stool.  He denies any rectal pain and reports this does not feel anything like when he had a fistula.  He was supposed to have a colonoscopy several years ago when this was going on but because of his finances he never had one done.  He denies any urinary symptoms.  No fever or vomiting but he has had some nausea.  He denies any chest pain or shortness of breath.  No pain in the rectum at this time.  Patient does not take any anticoagulation  The history is provided by the patient and medical records.  Rectal Bleeding     Past Medical History:  Diagnosis Date    Anal fistula    Anxiety    Arthritis    "hands maybe" (11/26/2014)   Heavy smoker    1ppd   Stutter     Patient Active Problem List   Diagnosis Date Noted   Perirectal abscess 11/26/2014   Alcohol abuse 11/26/2014   Sepsis ([place]) 11/26/2014   Atypical chest pain 11/26/2014    Past Surgical History:  Procedure Laterality Date   ANAL FISTULOTOMY N/A 08/03/2017   Procedure: EXAM UNDER ANESTHESIA WITH ANAL FISTULOTOMY;  Surgeon: Coralie Keens, MD;  Location: [place];  Service: General;  Laterality: N/A;   INCISION AND DRAINAGE ABSCESS Left 11/26/2014   buttocks   IRRIGATION AND DEBRIDEMENT BUTTOCKS Left 11/26/2014   Procedure: Incision and Drainage of Left Buttock Abscess;  Surgeon: Donnie Mesa, MD;  Location: [place];  Service: General;  Laterality: Left;   KNEE ARTHROSCOPY Right 1988/1989       Family History  Problem Relation Age of Onset   Diabetes Mellitus II Father     Social History   Tobacco Use   Smoking status: Every Day    Packs/day: 1.00    Years: 27.00    Pack years: 27.00    Types: Cigarettes   Smokeless tobacco: Never  Vaping Use   Vaping Use: Never used  Substance Use Topics   Alcohol use: Yes  Alcohol/week: 7.0 standard drinks    Types: 7 Glasses of wine per week    Comment: 6-12-beers nightly   Drug use: Yes    Types: Marijuana    Comment: smoked today 07-29-17, smokes daily    Home Medications Prior to Admission medications   Medication Sig Start Date End Date Taking? Authorizing Provider  hydrocortisone (ANUSOL-HC) 25 MG suppository Place 1 suppository (25 mg total) rectally 2 (two) times daily. 04/24/21  Yes Amarie Viles, Loree Fee, MD  MILK THISTLE PO Take 1 tablet by mouth daily.   Yes [provider]  omeprazole (PRILOSEC) 20 MG capsule Take 1 capsule (20 mg total) by mouth daily. 04/24/21  Yes Blanchie Dessert, MD  Turmeric Curcumin 500 MG CAPS Take by mouth.   Yes [provider]  aspirin EC 81 MG tablet  Take 81 mg by mouth daily. Patient not taking: Reported on 04/24/2021    [provider]  HYDROcodone-acetaminophen (NORCO/VICODIN) 5-325 MG tablet Take 1 tablet by mouth every 6 (six) hours as needed. Patient not taking: Reported on 04/24/2021 06/16/20   Sherol Dade E, PA-C  lidocaine (XYLOCAINE) 5 % ointment Apply 1 application topically as needed. Patient not taking: Reported on 04/24/2021 08/03/17   Coralie Keens, MD    Allergies    Patient has no known allergies.  Review of Systems   Review of Systems  Gastrointestinal:  Positive for hematochezia.  All other systems reviewed and are negative.  Physical Exam Updated Vital Signs BP (!) 147/88   Pulse 76   Temp 98.6 F (37 C)   Resp 15   Ht 5' 9.5" (1.765 m)   Wt 125.2 kg   SpO2 96%   BMI 40.17 kg/m   Physical Exam Vitals and nursing note reviewed. Exam conducted with a chaperone present.  Constitutional:      General: He is not in acute distress.    Appearance: He is well-developed.  HENT:     Head: Normocephalic and atraumatic.  Eyes:     Conjunctiva/sclera: Conjunctivae normal.     Pupils: Pupils are equal, round, and reactive to light.  Cardiovascular:     Rate and Rhythm: Normal rate and regular rhythm.     Heart sounds: No murmur heard. Pulmonary:     Effort: Pulmonary effort is normal. No respiratory distress.     Breath sounds: Normal breath sounds. No wheezing or rales.  Abdominal:     General: There is no distension.     Palpations: Abdomen is soft.     Tenderness: There is abdominal tenderness in the epigastric area, periumbilical area and left upper quadrant. There is no right CVA tenderness, left CVA tenderness, guarding or rebound.  Genitourinary:    Rectum: Guaiac result positive. No tenderness, anal fissure or external hemorrhoid.     Comments: Scars present from prior anal fistulotomy with 1 superficial ulcer present but no fissures, bleeding or hemorrhoids.  No internal masses  palpated. Musculoskeletal:        General: No tenderness. Normal range of motion.     Cervical back: Normal range of motion and neck supple.     Right lower leg: No edema.     Left lower leg: No edema.  Skin:    General: Skin is warm and dry.     Findings: No erythema or rash.  Neurological:     General: No focal deficit present.     Mental Status: He is alert and oriented to person, place, and time. Mental status is  at baseline.  Psychiatric:        Mood and Affect: Mood normal.        Behavior: Behavior normal.    ED Results / Procedures / Treatments   Labs (all labs ordered are listed, but only abnormal results are displayed) Labs Reviewed  COMPREHENSIVE METABOLIC PANEL - Abnormal; Notable for the following components:      Result Value   Glucose, Bld 205 (*)    All other components within normal limits  POC OCCULT BLOOD, ED - Abnormal; Notable for the following components:   Fecal Occult Bld POSITIVE (*)    All other components within normal limits  CBC WITH DIFFERENTIAL/PLATELET  LIPASE, BLOOD  URINALYSIS, ROUTINE W REFLEX MICROSCOPIC  ETHANOL    EKG None  Radiology CT ABDOMEN PELVIS W CONTRAST  Result Date: 04/24/2021 CLINICAL DATA:  Right lower quadrant pain.  Rectal bleeding. EXAM: CT ABDOMEN AND PELVIS WITH CONTRAST TECHNIQUE: Multidetector CT imaging of the abdomen and pelvis was performed using the standard protocol following bolus administration of intravenous contrast. CONTRAST:  166mL OMNIPAQUE IOHEXOL 300 MG/ML  SOLN COMPARISON:  04/08/2018 FINDINGS: Lower Chest: No acute findings. Hepatobiliary: Stable benign hemangioma is seen in the lateral segment of the left hepatic lobe measuring 3.8 cm. No other liver lesions are identified. Gallbladder is unremarkable. No evidence of biliary ductal dilatation. Pancreas:  No mass or inflammatory changes. Spleen: Within normal limits in size and appearance. Adrenals/Urinary Tract: No masses identified. No evidence of  ureteral calculi or hydronephrosis. Unremarkable unopacified urinary bladder. Stomach/Bowel: No evidence of obstruction, inflammatory process or abnormal fluid collections. Normal appendix visualized. Vascular/Lymphatic: No pathologically enlarged lymph nodes. No acute vascular findings. Aortic atherosclerotic calcification noted. Reproductive:  No mass or other significant abnormality. Other:  None. Musculoskeletal:  No suspicious bone lesions identified. IMPRESSION: No evidence of appendicitis or other acute findings. Stable benign left hepatic lobe hemangioma. Aortic Atherosclerosis (ICD10-I70.0). Electronically Signed   By: Marlaine Hind M.D.   On: 04/24/2021 18:37    Procedures Procedures   Medications Ordered in ED Medications  lactated ringers infusion ( Intravenous New Bag/Given 04/24/21 1643)  iohexol (OMNIPAQUE) 300 MG/ML solution 100 mL (100 mLs Intravenous Contrast Given 04/24/21 1744)    ED Course  I have reviewed the triage vital signs and the nursing notes.  Pertinent labs & imaging results that were available during my care of the patient were reviewed by me and considered in my medical decision making (see chart for details).    MDM Rules/Calculators/A&P                           Patient presenting today with 2 complaints.  One is of 2 weeks of bright red rectal bleeding that has been intermittent but been worsening.  He does have a history of [place] rectal abscess then with fistula but does not appear to have recurrence of his fistula today.  There is no hemorrhoids and on rectal exam there is no palpable masses.  There was no visual blood present with DRE but Hemoccult was positive.  Concern for possible internal hemorrhoids, diverticulosis or AVM.  Patient is also having abdominal pain but it is left upper quadrant abdominal pain.  He has no lower abdominal pain.  Concern for possible pancreatitis, atypical diverticulitis, mass or gastritis.  Patient does have heavy alcohol use  daily concern for also PUD.  Labs are pending.  Imaging is pending.  Hemoglobin is stable at 15.  6:55 PM Patient's labs are normal with normal LFTs, creatinine, lipase.  Hemoglobin is normal at 15.  UA without acute findings.  CT shows no acute findings today.  Patient's vital signs are stable.  Suspect patient's upper abdominal pain is gastritis versus PUD.  He was started on a PPI and given dietary restrictions.  Secondly patient's lower rectal bleeding could be from internal hemorrhoids, diverticulosis or possible AVM however hemoglobin is stable at this time.  Did stressed the importance of patient getting a colonoscopy.  Spoke with transition of care team as patient does not have insurance and he was given an appointment with internal medicine clinic and then hopefully they will be able to work with GI to get him a follow-up appointment and colonoscopy.  He was given return precautions.  He will continue to increase fiber in his diet.  Also will try Anusol suppositories.  MDM   Amount and/or Complexity of Data Reviewed Clinical lab tests: ordered and reviewed Tests in the radiology section of CPT: ordered and reviewed Tests in the medicine section of CPT: ordered and reviewed Independent visualization of images, tracings, or specimens: yes    Final Clinical Impression(s) / ED Diagnoses Final diagnoses:  Rectal bleeding  Acute gastritis without hemorrhage, unspecified gastritis type    Rx / DC Orders ED Discharge Orders          Ordered    omeprazole (PRILOSEC) 20 MG capsule  Daily        04/24/21 1851    hydrocortisone (ANUSOL-HC) 25 MG suppository  2 times daily        04/24/21 1851             Blanchie Dessert, MD 04/24/21 1857

## 2021-04-24 NOTE — Discharge Instructions (Signed)
The bleeding in your rectum may be coming from internal hemorrhoids however could be from diverticulosis.  Continue the fiber in your diet.  Also try doing warm soaks in a tub.  If you start having weakness, passing out, lightheadedness, shortness of breath or chest pain and increasing amounts of blood that you are passing return to the emergency room.  Otherwise follow-up with the internal medicine clinic so that you can get scheduled to see a GI doctor and have a colonoscopy.

## 2021-08-10 ENCOUNTER — Ambulatory Visit (INDEPENDENT_AMBULATORY_CARE_PROVIDER_SITE_OTHER): Payer: Managed Care, Other (non HMO) | Admitting: Family

## 2021-08-10 ENCOUNTER — Encounter: Payer: Self-pay | Admitting: Family

## 2021-08-10 VITALS — BP 123/75 | HR 71 | Temp 98.3°F | Ht 69.0 in | Wt 277.4 lb

## 2021-08-10 DIAGNOSIS — F419 Anxiety disorder, unspecified: Secondary | ICD-10-CM | POA: Diagnosis not present

## 2021-08-10 DIAGNOSIS — F32A Depression, unspecified: Secondary | ICD-10-CM | POA: Diagnosis not present

## 2021-08-10 DIAGNOSIS — K625 Hemorrhage of anus and rectum: Secondary | ICD-10-CM

## 2021-08-10 DIAGNOSIS — K5909 Other constipation: Secondary | ICD-10-CM

## 2021-08-10 DIAGNOSIS — F17210 Nicotine dependence, cigarettes, uncomplicated: Secondary | ICD-10-CM

## 2021-08-10 DIAGNOSIS — F1010 Alcohol abuse, uncomplicated: Secondary | ICD-10-CM

## 2021-08-10 MED ORDER — BUPROPION HCL ER (SR) 100 MG PO TB12: 100.0000 mg | ORAL_TABLET | Freq: Two times a day (BID) | ORAL | 0 refills | Status: DC

## 2021-08-10 NOTE — Patient Instructions (Addendum)
Welcome to [facility]! It was a pleasure meeting you today.  As discussed, I have sent Bupropion to your pharmacy for your anxiety & depression.  Please schedule a 2-4 week  follow up visit today and we can combine a follow up visit with a physical with fasting labs.  Other medical concerns will need to be a separate visit and can be virtual. I have sent a referral to gastroenterology regarding you rectal bleeding, they will call you directly.    PLEASE NOTE:  If you had any LAB tests please let us know if you have not heard back within a few days. You may see your results on MyChart before we have a chance to review them but we will give you a call once they are reviewed by [place]. If we ordered any REFERRALS today, please let us know if you have not heard from their office within the next week.  Let us know through MyChart if you are needing REFILLS, or have your pharmacy send [place] the request. You can also use MyChart to communicate with me or any office staff.  Please try these tips to maintain a healthy lifestyle:  Eat most of your calories during the day when you are active. Eliminate processed foods including packaged sweets (pies, cakes, cookies), reduce intake of potatoes, white bread, white pasta, and white rice. Look for whole grain options, oat flour or almond flour.  Each meal should contain half fruits/vegetables, one quarter protein, and one quarter carbs (no bigger than a computer mouse).  Cut down on sweet beverages. This includes juice, soda, and sweet tea. Also watch fruit intake, though this is a healthier sweet option, it still contains natural sugar! Limit to 3 servings daily.  Drink at least 1 glass of water with each meal and aim for at least 8 glasses per day  Exercise at least 150 minutes every week.

## 2021-08-10 NOTE — Progress Notes (Incomplete)
New Patient Office Visit  Subjective:  Patient ID: Jon Wade, male    DOB: 1971/02/16  Age: 51 y.o. MRN: [id number]  CC:  Chief Complaint  Patient presents with   Establish Care   Abdominal Pain    Pt c/o constant pain in abdomen for years. Usually hurts more when he needs to go to the restroom.     Anxiety    Pt c/o anxiety and depression. He states he has dealt with it for years. And He tried several medications for it.    Erectile Dysfunction    Pt c/o erectile dysfunction, He has not been able to be intimate with his partner.    HPI [name] presents for establishing care and to discuss a few problems. Anxiety/Depression: Patient complains of anxiety disorder and depression .   He has the following symptoms: difficulty concentrating, fatigue, irritable, palpitations, racing thoughts.  Onset of symptoms was approximately  years ago, He denies current suicidal and homicidal ideation.  Possible organic causes contributing are: none.  Risk factors: negative life event : 2 divorces and previous episode of depression  Previous treatment includes Prozac and Wellbutrin and individual therapy.  He complains of the following side effects from the treatment:  felt no emotion, stuttering was worse . Reports last treated 14 years ago, states he currently is self-medicating with drinks 4-6 beers nightly. Has trouble with emotional connections, has a current fiance. States he is self-employed. Depression screen PHQ 2/9 08/05/2015  Decreased Interest 0  Down, Depressed, Hopeless 0  PHQ - 2 Score 0    No flowsheet data found.   Past Medical History:  Diagnosis Date   Anal fistula    Anxiety    Arthritis    "hands maybe" (11/26/2014)   Depression    GERD (gastroesophageal reflux disease)    Heavy smoker    1ppd   Stutter     Past Surgical History:  Procedure Laterality Date   ANAL FISTULOTOMY N/A 08/03/2017   Procedure: EXAM UNDER ANESTHESIA WITH ANAL  FISTULOTOMY;  Surgeon: Coralie Keens, MD;  Location: [place];  Service: General;  Laterality: N/A;   INCISION AND DRAINAGE ABSCESS Left 11/26/2014   buttocks   IRRIGATION AND DEBRIDEMENT BUTTOCKS Left 11/26/2014   Procedure: Incision and Drainage of Left Buttock Abscess;  Surgeon: Donnie Mesa, MD;  Location: [place];  Service: General;  Laterality: Left;   KNEE ARTHROSCOPY Right 1988/1989    Family History  Problem Relation Age of Onset   Diabetes Mellitus II Father    Social History   Socioeconomic History   Marital status: Divorced    Spouse name: Not on file   Number of children: Not on file   Years of education: Not on file   Highest education level: Not on file  Occupational History   Not on file  Tobacco Use   Smoking status: Every Day    Packs/day: 1.00    Years: 27.00    Pack years: 27.00    Types: Cigarettes   Smokeless tobacco: Never  Vaping Use   Vaping Use: Some days  Substance and Sexual Activity   Alcohol use: Yes    Alcohol/week: 7.0 standard drinks    Types: 7 Glasses of wine per week    Comment: 6-12-beers nightly   Drug use: Yes    Types: Marijuana    Comment: smoked today 07-29-17, smokes daily   Sexual activity: Yes  Other Topics Concern   Not on  file  Social History Narrative   Not on file   Social Determinants of Health   Financial Resource Strain: Not on file  Food Insecurity: Not on file  Transportation Needs: Not on file  Physical Activity: Not on file  Stress: Not on file  Social Connections: Not on file  Intimate Partner Violence: Not on file    Objective:   Today's Vitals: BP 123/75 (BP Location: Left Arm, Patient Position: Sitting, Cuff Size: Large)    Pulse 71    Temp 98.3 F (36.8 C) (Temporal)    Ht '5\' 9"'$  (1.753 m)    Wt 277 lb 6 oz (125.8 kg)    SpO2 95%    BMI 40.96 kg/m   Physical Exam Vitals and nursing note reviewed.  Constitutional:      General: He is not in acute distress.     Appearance: Normal appearance. He is morbidly obese.  HENT:     Head: Normocephalic.  Cardiovascular:     Rate and Rhythm: Normal rate and regular rhythm.  Pulmonary:     Effort: Pulmonary effort is normal.     Breath sounds: Normal breath sounds.  Musculoskeletal:        General: Normal range of motion.     Cervical back: Normal range of motion.  Skin:    General: Skin is warm and dry.  Neurological:     Mental Status: He is alert and oriented to person, place, and time.  Psychiatric:        Mood and Affect: Mood normal.    Assessment & Plan:   Problem List Items Addressed This Visit   None   Outpatient Encounter Medications as of 08/10/2021  Medication Sig   MILK THISTLE PO Take 1 tablet by mouth daily.   omeprazole (PRILOSEC) 20 MG capsule Take 1 capsule (20 mg total) by mouth daily. (Patient taking differently: Take 20 mg by mouth as needed.)   Turmeric Curcumin 500 MG CAPS Take by mouth.   [DISCONTINUED] aspirin EC 81 MG tablet Take 81 mg by mouth daily.   [DISCONTINUED] HYDROcodone-acetaminophen (NORCO/VICODIN) 5-325 MG tablet Take 1 tablet by mouth every 6 (six) hours as needed.   [DISCONTINUED] hydrocortisone (ANUSOL-HC) 25 MG suppository Place 1 suppository (25 mg total) rectally 2 (two) times daily.   [DISCONTINUED] lidocaine (XYLOCAINE) 5 % ointment Apply 1 application topically as needed.   No facility-administered encounter medications on file as of 08/10/2021.    Follow-up: No follow-ups on file.   Jeanie Sewer, NP

## 2021-08-11 ENCOUNTER — Encounter: Payer: Self-pay | Admitting: Family

## 2021-08-11 DIAGNOSIS — K5909 Other constipation: Secondary | ICD-10-CM | POA: Insufficient documentation

## 2021-08-11 NOTE — Assessment & Plan Note (Signed)
Chronic - reports 1.5 ppd smoker for many years. has quit on his own in past, but stress causes him to restart. Advised on complete cessation and options to help him quit, he will consider medication. ?

## 2021-08-11 NOTE — Assessment & Plan Note (Signed)
pt reports having ongoing bloody, hard, stools daily, some normal size, some small balls. Takes psyllium husk powder daily, denies use of any laxatives. Advised on proper water intake, high fiber foods, referring to GI for colonoscopy d/t rectal bleeding.  ?*hx of perirectal abscess and + hemocult in 2016 found in hx.advised to f/u w/GI, but no other notes in chart. ?

## 2021-08-11 NOTE — Assessment & Plan Note (Addendum)
Chronic - appears to be self-medicating with daily consumption of beer. pt feels his anxiety is worse right now, having a lot of medical problems, recently moved back to town & living with his GF. Discussed med options, mutually agreed to start Bupropion, advised on use & SE. f/u in 2-4 weeks. ?

## 2021-08-11 NOTE — Assessment & Plan Note (Signed)
Chronic - pt reports he is still drinking a 6pack of beer daily. Advised on AA resources, will discuss medication tx at next visit, not enough time today. ?

## 2021-08-24 ENCOUNTER — Ambulatory Visit (INDEPENDENT_AMBULATORY_CARE_PROVIDER_SITE_OTHER): Payer: Managed Care, Other (non HMO) | Admitting: Family

## 2021-08-24 ENCOUNTER — Encounter: Payer: Self-pay | Admitting: Family

## 2021-08-24 VITALS — BP 124/81 | HR 74 | Temp 98.1°F | Ht 69.0 in | Wt 277.1 lb

## 2021-08-24 DIAGNOSIS — H60502 Unspecified acute noninfective otitis externa, left ear: Secondary | ICD-10-CM

## 2021-08-24 DIAGNOSIS — R739 Hyperglycemia, unspecified: Secondary | ICD-10-CM

## 2021-08-24 DIAGNOSIS — Z0001 Encounter for general adult medical examination with abnormal findings: Secondary | ICD-10-CM

## 2021-08-24 DIAGNOSIS — F32A Depression, unspecified: Secondary | ICD-10-CM

## 2021-08-24 DIAGNOSIS — F419 Anxiety disorder, unspecified: Secondary | ICD-10-CM

## 2021-08-24 LAB — LIPID PANEL
Cholesterol: 204 mg/dL — ABNORMAL HIGH (ref 0–200)
HDL: 51.8 mg/dL (ref >39.00)
LDL Cholesterol: 114 mg/dL — ABNORMAL HIGH (ref 0–99)
NonHDL: 152.1
Total CHOL/HDL Ratio: 4
Triglycerides: 193 mg/dL — ABNORMAL HIGH (ref 0.0–149.0)
VLDL: 38.6 mg/dL (ref 0.0–40.0)

## 2021-08-24 LAB — POCT GLYCOSYLATED HEMOGLOBIN (HGB A1C): Hemoglobin A1C: 5.5 % (ref 4.0–5.6)

## 2021-08-24 MED ORDER — NEOMYCIN-POLYMYXIN-HC 3.5-10000-1 OT SOLN: 4.0000 [drp] | Freq: Three times a day (TID) | OTIC | 0 refills | Status: DC

## 2021-08-24 MED ORDER — BUSPIRONE HCL 5 MG PO TABS: 5.0000 mg | ORAL_TABLET | Freq: Two times a day (BID) | ORAL | 0 refills | Status: DC

## 2021-08-24 MED ORDER — NEOMYCIN-POLYMYXIN-HC 3.5-10000-1 OT SOLN: 3.0000 [drp] | Freq: Two times a day (BID) | OTIC | 0 refills | Status: AC

## 2021-08-24 MED ORDER — HYDROXYZINE HCL 10 MG PO TABS: 10.0000 mg | ORAL_TABLET | ORAL | 0 refills | Status: DC | PRN

## 2021-08-24 NOTE — Patient Instructions (Addendum)
It was very nice to see you today! ? ?Go to the lab today for blood work. ?I have sent ear drops for your left ear canal infection. Use 3-4 drops 2 times per day for up to 5 days. ?I have also sent in a new medication for your anxiety, Buspirone, you can start this today, start with 1 pill, if no side effects, but also not helping symptoms, increase to 2 pills each dose.  Also Hydroxyzine was sent to help in the evenings if you still need to calm down from the day. ?DO NOT drink any alcohol when taking these medications. ? ?Schedule a 1 month follow up visit today. ? ? ?PLEASE NOTE: ? ?If you had any lab tests please let us know if you have not heard back within a few days. You may see your results on MyChart before we have a chance to review them but we will give you a call once they are reviewed by [place]. If we ordered any referrals today, please let us know if you have not heard from their office within the next week.  ? ?Please try these tips to maintain a healthy lifestyle: ? ?Eat most of your calories during the day when you are active. Eliminate processed foods including packaged sweets (pies, cakes, cookies), reduce intake of potatoes, white bread, white pasta, and white rice. Look for whole grain options, oat flour or almond flour. ? ?Each meal should contain half fruits/vegetables, one quarter protein, and one quarter carbs (no bigger than a computer mouse). ? ?Cut down on sweet beverages. This includes juice, soda, and sweet tea. Also watch fruit intake, though this is a healthier sweet option, it still contains natural sugar! Limit to 3 servings daily. ? ?Drink at least 1 glass of water with each meal and aim for at least 8 glasses per day ? ?Exercise at least 150 minutes every week.  ? ?

## 2021-08-24 NOTE — Assessment & Plan Note (Signed)
Chronic- wellbutrin caused nightmares, near syncopal feeling, stopped taking after 3 weeks, only took qam. Discussed other options, pt would like med just for anxiety vs SSRI/SNRI. Starting Buspar & Vistaril, advised on use & SE. f/u 1 month. ?

## 2021-08-24 NOTE — Progress Notes (Signed)
?Phone: [phone] ?  ?Subjective:  ?Patient 51 y.o. male presenting for annual physical. ? ?Chief Complaint  ?Patient presents with  ? Anxiety  ?  Pt states he stopped the Wellbutrin 2 days ago due to having nightmares and feeling like he's going to pass out.   ? Annual Exam  ?  Fasting W / Labs  ? ?HPI: ?Anxiety/Depression: Patient complains of anxiety disorder and depression.   ?He has the following symptoms: difficulty concentrating, fatigue, irritable, palpitations, racing thoughts.  ?Onset of symptoms was approximately  years ago, He denies current suicidal and homicidal ideation.  ?Possible organic causes contributing are: none.  ?Risk factors: negative life event : 2 divorces and previous episode of depression  ?Previous treatment includes Prozac and Wellbutrin and individual therapy.  He complains of the following side effects from the treatment: felt no emotion, stuttering was worse. Reports trying Bupropion given last visit, but had nightmares, "weird" feeling, near-syncopal feeling. ? ?See problem oriented charting- ?ROS- full  review of systems was completed and negative  ?except for: anxiety noted in HPI above. ? ?The following were reviewed and entered/updated in epic: ?Past Medical History:  ?Diagnosis Date  ? Anal fistula   ? Anxiety   ? Arthritis   ? "hands maybe" (11/26/2014)  ? Depression   ? GERD (gastroesophageal reflux disease)   ? Heavy smoker   ? 1ppd  ? Sepsis ([place]) 11/26/2014  ? Stutter   ? ?Patient Active Problem List  ? Diagnosis Date Noted  ? Chronic constipation 08/11/2021  ? Cigarette nicotine dependence without complication 40/12/6759  ? Anxiety and depression 08/10/2021  ? Perirectal abscess 11/26/2014  ? Alcohol abuse 11/26/2014  ? Atypical chest pain 11/26/2014  ? ?Past Surgical History:  ?Procedure Laterality Date  ? ANAL FISTULOTOMY N/A 08/03/2017  ? Procedure: EXAM UNDER ANESTHESIA WITH ANAL FISTULOTOMY;  Surgeon: Coralie Keens, MD;  Location: [facility];   Service: General;  Laterality: N/A;  ? INCISION AND DRAINAGE ABSCESS Left 11/26/2014  ? buttocks  ? IRRIGATION AND DEBRIDEMENT BUTTOCKS Left 11/26/2014  ? Procedure: Incision and Drainage of Left Buttock Abscess;  Surgeon: Donnie Mesa, MD;  Location: [place] [facility];  Service: General;  Laterality: Left;  ? KNEE ARTHROSCOPY Right 1988/1989  ? ? ?Family History  ?Problem Relation Age of Onset  ? Diabetes Mellitus II Father   ? ? ?Medications- reviewed and updated ?Current Outpatient Medications  ?Medication Sig Dispense Refill  ? busPIRone (BUSPAR) 5 MG tablet Take 1-2 tablets (5-10 mg total) by mouth 2 (two) times daily. 60 tablet 0  ? hydrOXYzine (ATARAX) 10 MG tablet Take 1-2 tablets (10-20 mg total) by mouth as needed for anxiety. Take in the evenings if needed to calm your anxiety. 30 tablet 0  ? MILK THISTLE PO Take 1 tablet by mouth daily.    ? omeprazole (PRILOSEC) 20 MG capsule Take 1 capsule (20 mg total) by mouth daily. 14 capsule 0  ? Turmeric Curcumin 500 MG CAPS Take by mouth.    ? buPROPion ER (WELLBUTRIN SR) 100 MG 12 hr tablet Take 1 tablet (100 mg total) by mouth 2 (two) times daily. Start with one pill in the morning for 3-4 days, then increase to twice a day if needed to control anxiety symptoms. (Patient not taking: Reported on 08/24/2021) 60 tablet 0  ? neomycin-polymyxin-hydrocortisone (CORTISPORIN) OTIC solution Place 3 drops into the left ear in the morning and at bedtime for 5 days. 10 mL 0  ? ?No current facility-administered medications  for this visit.  ? ? ?Allergies-reviewed and updated ?No Known Allergies ? ?Social History  ? ?Social History Narrative  ? Not on file  ? ?Objective  ?Objective:  ?BP 124/81 (BP Location: Left Arm, Patient Position: Sitting, Cuff Size: Large)   Pulse 74   Temp 98.1 ?F (36.7 ?C) (Temporal)   Ht '5\' 9"'$  (1.753 m)   Wt 277 lb 2 oz (125.7 kg)   SpO2 94%   BMI 40.92 kg/m?  ?Physical Exam ?Vitals and nursing note reviewed.  ?Constitutional:   ?   General: He is not in  acute distress. ?   Appearance: Normal appearance.  ?HENT:  ?   Head: Normocephalic.  ?   Right Ear: Tympanic membrane and external ear normal. No decreased hearing noted.  ?   Left Ear: No decreased hearing noted. No drainage or swelling. Tympanic membrane is scarred.  ?   Ears:  ?   Comments: ear canal erythematous ?   Nose: Nose normal.  ?   Mouth/Throat:  ?   Mouth: Mucous membranes are moist.  ?Eyes:  ?   Extraocular Movements: Extraocular movements intact.  ?Cardiovascular:  ?   Rate and Rhythm: Normal rate and regular rhythm.  ?Pulmonary:  ?   Effort: Pulmonary effort is normal.  ?   Breath sounds: Normal breath sounds.  ?Abdominal:  ?   General: Abdomen is flat. There is no distension.  ?   Palpations: Abdomen is soft.  ?   Tenderness: There is no abdominal tenderness.  ?Musculoskeletal:     ?   General: Normal range of motion.  ?   Cervical back: Normal range of motion.  ?Lymphadenopathy:  ?   Head:  ?   Left side of head: Posterior auricular adenopathy present. No preauricular adenopathy.  ?   Cervical: No cervical adenopathy.  ?Skin: ?   General: Skin is warm and dry.  ?Neurological:  ?   Mental Status: He is alert and oriented to person, place, and time.  ?Psychiatric:     ?   Mood and Affect: Mood normal.     ?   Behavior: Behavior normal.     ?   Judgment: Judgment normal.  ? ?  ?Assessment and Plan  ? ?Health Maintenance counseling: ?1. Anticipatory guidance: Patient counseled regarding regular dental exams q6 months, eye exams yearly, avoiding smoking and second hand smoke, limiting alcohol to 2 beverages per day.   ?2. Risk factor reduction:  Advised patient of need for regular exercise and diet rich in fruits and vegetables to reduce risk of heart attack and stroke. Exercise- none.   ?Wt Readings from Last 3 Encounters:  ?08/24/21 277 lb 2 oz (125.7 kg)  ?08/10/21 277 lb 6 oz (125.8 kg)  ?04/24/21 276 lb (125.2 kg)  ? ?3. Immunizations/screenings/ancillary studies ?Immunization History   ?Administered Date(s) Administered  ? Pneumococcal Polysaccharide-23 11/27/2014  ? Tdap 02/25/2020  ? ?Health Maintenance Due  ?Topic Date Due  ? COVID-19 Vaccine (1) Never done  ? Hepatitis C Screening  Never done  ? Zoster Vaccines- Shingrix (1 of 2) Never done  ?  ?4. Prostate cancer screening >55yo - risk factors? none  ?5. Colon cancer screening: due - referral sent last month, states he has not heard anything. ?6. Skin cancer screening- advised regular sunscreen use. Denies worrisome, changing, or new skin lesions.  ?7. Smoking associated screening (lung cancer screening, AAA screen 65-75, UA)-  smoker- 1ppd ?8. STD screening - N/A ? ?Problem List  Items Addressed This Visit   ? ?  ? Other  ? Anxiety and depression - Primary  ?  Chronic- wellbutrin caused nightmares, near syncopal feeling, stopped taking after 3 weeks, only took qam. Discussed other options, pt would like med just for anxiety vs SSRI/SNRI. Starting Buspar & Vistaril, advised on use & SE. f/u 1 month. ?  ?  ? Relevant Medications  ? busPIRone (BUSPAR) 5 MG tablet  ? hydrOXYzine (ATARAX) 10 MG tablet  ? ?Other Visit Diagnoses   ? ? Elevated blood sugar level      ? Glucose in ER >200 a few months ago. A1C today 5.5, wnl. ? ?Relevant Orders  ? POCT HgB A1C (Completed)  ? Acute otitis externa of left ear, unspecified type      ? found on exam -pt with c/o left outer ear tenderness in lymph node region, ear canal erythematous. sending Cortisporin, advised on use & SE. ? ?Relevant Medications  ? neomycin-polymyxin-hydrocortisone (CORTISPORIN) OTIC solution  ? Encounter for general adult medical examination with abnormal findings      ? CMP & CBC recently done in ER all wnl, adding labs today. ? ?Relevant Orders  ? Lipid panel  ? TSH  ? ?  ? ?Recommended follow up: Return in about 4 weeks (around 09/21/2021) for anxiety. ?Future Appointments  ?Date Time Provider [place]  ?09/21/2021  8:20 AM Jeanie Sewer, NP [facility]  ? ? ?Lab/Order  associations:fasting ? ?  ?Jeanie Sewer, NP ? ? ?

## 2021-08-25 ENCOUNTER — Encounter: Payer: Self-pay | Admitting: Gastroenterology

## 2021-08-25 ENCOUNTER — Ambulatory Visit (INDEPENDENT_AMBULATORY_CARE_PROVIDER_SITE_OTHER): Payer: Managed Care, Other (non HMO) | Admitting: Gastroenterology

## 2021-08-25 ENCOUNTER — Other Ambulatory Visit (INDEPENDENT_AMBULATORY_CARE_PROVIDER_SITE_OTHER): Payer: Managed Care, Other (non HMO)

## 2021-08-25 VITALS — HR 73 | Ht 69.0 in | Wt 280.1 lb

## 2021-08-25 DIAGNOSIS — F1010 Alcohol abuse, uncomplicated: Secondary | ICD-10-CM

## 2021-08-25 DIAGNOSIS — K921 Melena: Secondary | ICD-10-CM

## 2021-08-25 DIAGNOSIS — R1012 Left upper quadrant pain: Secondary | ICD-10-CM | POA: Diagnosis not present

## 2021-08-25 LAB — GAMMA GT: GGT: 53 U/L — ABNORMAL HIGH (ref 7–51)

## 2021-08-25 LAB — TSH: TSH: 2.76 u[IU]/mL (ref 0.35–5.50)

## 2021-08-25 MED ORDER — SUTAB 1479-225-188 MG PO TABS: 1.0000 | ORAL_TABLET | Freq: Once | ORAL | 0 refills | Status: AC

## 2021-08-25 NOTE — Patient Instructions (Addendum)
If you are age 52 or older, your body mass index should be between 23-30. Your Body mass index is 41.37 kg/m?Marland Kitchen If this is out of the aforementioned range listed, please consider follow up with your Primary Care Provider. ? ?If you are age 20 or younger, your body mass index should be between 19-25. Your Body mass index is 41.37 kg/m?Marland Kitchen If this is out of the aformentioned range listed, please consider follow up with your Primary Care Provider.  ? ?Your provider has requested that you go to the basement level for lab work before leaving today. Press "B" on the elevator. The lab is located at the first door on the left as you exit the elevator.  ? ?You have been scheduled for a colonoscopy. Please follow written instructions given to you at your visit today.  ?Please pick up your prep supplies at the pharmacy within the next 1-3 days. ?If you use inhalers (even only as needed), please bring them with you on the day of your procedure.  ? ?You have been scheduled for an abdominal ultrasound at [facility] (1st floor of hospital) on 09/01/21 at 8:30AM. Please arrive 15 minutes prior to your appointment for registration. Make certain not to have anything to eat or drink 6 hours prior to your appointment. Should you need to reschedule your appointment, please contact radiology at [phone]. This test typically takes about 30 minutes to perform.  ? ? ? ?The [facility] providers would like to encourage you to use [facility] to communicate with providers for non-urgent requests or questions.  Due to long hold times on the telephone, sending your provider a message by [facility] may be a faster and more efficient way to get a response.  Please allow 48 business hours for a response.  Please remember that this is for non-urgent requests.  ? ?It was a pleasure to see you today! ? ?Thank you for trusting me with your gastrointestinal care!   ? ?Scott E.Candis Schatz, MD ? ?

## 2021-08-25 NOTE — Progress Notes (Signed)
? ?HPI : Jon Wade is a very pleasant 52 year old male with a history of anxiety, depression and perianal fistula referred to [place] by Jeanie Sewer, NP for further evaluation of hematochezia.  The patient states that he has had problems with intermittent rectal bleeding for many years, but recently the bleeding is increased in frequency and severity.  He has been seeing blood about once every 1-2 weeks, sometimes with just one bowel movement, and sometimes with every bowel movement for 3-4 days. He describes the bleeding as profuse, often with the presence clots.  He denies any pain with the passage of stool, but does have some sensitivity when wiping.  He denies any symptoms suggestive of prolapsing hemorrhoids. ?In 2019 he had an anal fissure and a perianal fistula.  At that time he was having significant perianal pain, tenderness, fevers, swelling and pain with defecation.  He underwent a fistulotomy and this resolved his discomfort, but he does continue to have drainage of thin, blood tinged fluid  ?His current symptoms are not like the symptoms he had in 2019. ? ?He also has frequent bowel movements, typically 4-6 per day, mostly concentrated in the morning.  Stools are typically soft to semi-formed, not watery.  Rarely completely formed.  Sometimes has urgency.  No nocturnal bowel movements.  This has been his norm for the past year or so.  He has taken psyllium husk powder in the past and it improves his stool consistency. ? ?He has a chronic abdominal discomfort localized to the left upper quadrant.  The pain is always there and is often worse when he has rectal bleeding.  Pain is not usually related to meals.  It is not severe.  No associated nausea or vomiting.   ? ?No family history of colon cancer. ?He has never had a colonoscopy.  ? ?He drinks excessively, usually 6-12 beers in the evenings before bed.  He has been doing this for many years.  He says he drinks because it helps with his anxiety and  is the only way he is able to get to sleep.  He recently got health insurance and so has sought out treatment for his anxiety, which he realizes is a problem.  He was recently started on buspirone.  He is hopeful that this will help and he will not feel the need to drink so much to get to sleep. ? ? ?He went to the ED in November with symptoms of abdominal pain and rectal bleeding.  A CT of the abdomen/pelvis showed a benign hepatic hemangioma, and normal appearing liver without evidence of steatosis and no other abnormalities. ?Past Medical History:  ?Diagnosis Date  ? Anal fistula   ? Anxiety   ? Arthritis   ? "hands maybe" (11/26/2014)  ? Depression   ? GERD (gastroesophageal reflux disease)   ? Heavy smoker   ? 1ppd  ? Sepsis ([place]) 11/26/2014  ? Stutter   ? ? ? ?Past Surgical History:  ?Procedure Laterality Date  ? ANAL FISTULOTOMY N/A 08/03/2017  ? Procedure: EXAM UNDER ANESTHESIA WITH ANAL FISTULOTOMY;  Surgeon: Coralie Keens, MD;  Location: [facility];  Service: General;  Laterality: N/A;  ? COLONOSCOPY    ? INCISION AND DRAINAGE ABSCESS Left 11/26/2014  ? buttocks  ? IRRIGATION AND DEBRIDEMENT BUTTOCKS Left 11/26/2014  ? Procedure: Incision and Drainage of Left Buttock Abscess;  Surgeon: Donnie Mesa, MD;  Location: [facility];  Service: General;  Laterality: Left;  ? KNEE ARTHROSCOPY Right  1988/1989  ? ?Family History  ?Problem Relation Age of Onset  ? Diabetes Mellitus II Father   ? ?Social History  ? ?Tobacco Use  ? Smoking status: Every Day  ?  Packs/day: 1.00  ?  Years: 27.00  ?  Pack years: 27.00  ?  Types: Cigarettes  ? Smokeless tobacco: Never  ?Vaping Use  ? Vaping Use: Some days  ?Substance Use Topics  ? Alcohol use: Yes  ?  Alcohol/week: 7.0 standard drinks  ?  Types: 7 Glasses of wine per week  ?  Comment: 6-12-beers nightly  ? Drug use: Yes  ?  Types: Marijuana  ?  Comment: smoked today 07-29-17, smokes daily  ? ?Current Outpatient Medications  ?Medication Sig Dispense Refill  ?  busPIRone (BUSPAR) 5 MG tablet Take 1-2 tablets (5-10 mg total) by mouth 2 (two) times daily. 60 tablet 0  ? hydrOXYzine (ATARAX) 10 MG tablet Take 1-2 tablets (10-20 mg total) by mouth as needed for anxiety. Take in the evenings if needed to calm your anxiety. 30 tablet 0  ? MILK THISTLE PO Take 1 tablet by mouth daily.    ? neomycin-polymyxin-hydrocortisone (CORTISPORIN) OTIC solution Place 3 drops into the left ear in the morning and at bedtime for 5 days. 10 mL 0  ? Turmeric Curcumin 500 MG CAPS Take by mouth.    ? ?No current facility-administered medications for this visit.  ? ?No Known Allergies ? ? ?Review of Systems: ?All systems reviewed and negative except where noted in HPI.  ? ? ?No results found. ? ?Physical Exam: ?Pulse 73   Ht '5\' 9"'$  (1.753 m)   Wt 280 lb 2 oz (127.1 kg)   BMI 41.37 kg/m?  ?Constitutional: Pleasant,well-developed, Hispanic male in no acute distress. ?HEENT: Normocephalic and atraumatic. Conjunctivae are normal. No scleral icterus. ?Neck supple.  ?Cardiovascular: Normal rate, regular rhythm.  ?Pulmonary/chest: Effort normal and breath sounds normal. No wheezing, rales or rhonchi. ?Abdominal: Soft, nondistended, nontender. Bowel sounds active throughout. There are no masses palpable. No hepatomegaly. ?Extremities: no edema ?Rectal: Deferred until time of colonoscopy ?Neurological: Alert and oriented to person place and time. ?Skin: Skin is warm and dry. No rashes noted. ?Psychiatric: Normal mood and affect. Behavior is normal. ? ?CBC ?   ?Component Value Date/Time  ? WBC 7.8 04/24/2021 1505  ? RBC 4.49 04/24/2021 1505  ? HGB 14.7 04/24/2021 1505  ? HCT 42.6 04/24/2021 1505  ? PLT 199 04/24/2021 1505  ? MCV 94.9 04/24/2021 1505  ? MCH 32.7 04/24/2021 1505  ? MCHC 34.5 04/24/2021 1505  ? RDW 12.7 04/24/2021 1505  ? LYMPHSABS 1.9 04/24/2021 1505  ? MONOABS 0.2 04/24/2021 1505  ? EOSABS 0.1 04/24/2021 1505  ? BASOSABS 0.0 04/24/2021 1505  ? ? ?CMP  ?   ?Component Value Date/Time  ? NA  136 04/24/2021 1505  ? K 4.1 04/24/2021 1505  ? CL 99 04/24/2021 1505  ? CO2 26 04/24/2021 1505  ? GLUCOSE 205 (H) 04/24/2021 1505  ? BUN 15 04/24/2021 1505  ? CREATININE 0.84 04/24/2021 1505  ? CALCIUM 9.1 04/24/2021 1505  ? PROT 7.0 04/24/2021 1505  ? ALBUMIN 3.9 04/24/2021 1505  ? AST 29 04/24/2021 1505  ? ALT 39 04/24/2021 1505  ? ALKPHOS 59 04/24/2021 1505  ? BILITOT 0.9 04/24/2021 1505  ? GFRNONAA >60 04/24/2021 1505  ? GFRAA >60 04/08/2018 1209  ? ? ? ?ASSESSMENT AND PLAN: ?51 year old male with history of perianal fistula/fissure s/p fistulotomy with recent increase  in painless rectal bleeding with presence of clots.  No recurrence of fissure or fistula symptoms.  I suspect his rectal bleeding is secondary to internal hemorrhoids.  He is overdue for his initial screening colonoscopy, so we will schedule him for this now.  We will also exclude any other causes for hematochezia at that time.  In the meantime, I suggest that he increase his psyllium husk to twice daily to improve his stool bulk and consistency and reduce hemorrhoidal bleeding.  I am hopeful that this will also decrease his multiple bowel movements in the morning.   ?We will check H. pylori stool antigen given his report of left upper quadrant pain. ?Given his history of heavy alcohol use, I would like to get an ultrasound to assess for steatosis and to get a GGT.  His liver enzymes have historically been within normal limits.  I am hopeful that as his anxiety is adequately addressed, he will be able to decrease his alcohol consumption.  We discussed the potential risk of his ongoing alcohol use to include cirrhosis, dementia, increased risk of cancers. ? ?Hematochezia, presumed hemorrhoidal ?-Colonoscopy ?-Increase psyllium to BID ? ?Alcohol use disorder ?-RUQUS ?-GGT ?-Continue to manage anxiety with PCM ? ?Left upper quadrant abdominal pain ?-H pylori Ag ? ?The details, risks (including bleeding, perforation, infection, missed lesions,  medication reactions and possible hospitalization or surgery if complications occur), benefits, and alternatives to colonoscopy with possible biopsy and possible polypectomy were discussed with the patient and he con

## 2021-08-27 ENCOUNTER — Encounter: Payer: Self-pay | Admitting: Gastroenterology

## 2021-08-27 NOTE — Progress Notes (Signed)
Hi Jon Wade, ? ?Your thyroid level is in normal range. ? ?Your total cholesterol number & LDL (bad #) are high. Triglycerides are also too high due and this most likely is from your daily alcohol intake but also a high carbohydrate diet can contribute. You must reduce both. ?You also need to reduce any fried foods, nonnutritional snacks e.g. chips/cookies,pies, cakes and candies, fatty meat (red meat), high fat dairy foods:  including cheese, milk, ice cream.  ?Increase fruits/vegetables/fiber.   ?Continue or restart an exercise routine, shooting for 69mn 5-7days per week.  ? ?Recommend repeating fasting lipids (no food or drink except black coffee or water after midnight) in 6 months with an office visit. ? ?Any questions, let me know!

## 2021-09-01 ENCOUNTER — Ambulatory Visit (HOSPITAL_COMMUNITY)
Admission: RE | Admit: 2021-09-01 | Discharge: 2021-09-01 | Disposition: A | Discharge status: Home / Self Care | Payer: Commercial Managed Care - HMO | Source: Ambulatory Visit | Attending: Gastroenterology | Admitting: Gastroenterology

## 2021-09-01 DIAGNOSIS — F1010 Alcohol abuse, uncomplicated: Secondary | ICD-10-CM | POA: Diagnosis present

## 2021-09-01 DIAGNOSIS — K921 Melena: Secondary | ICD-10-CM | POA: Diagnosis present

## 2021-09-04 NOTE — Progress Notes (Signed)
Jon Wade,  ?Your ultrasound showed fatty changes in the liver, consistent with damage from chronic alcohol use.  The liver did not appear cirrhotic, but ongoing heavy alcohol use may lead to cirrhosis.  I strongly recommend you make efforts to cut back on your alcohol use. ?There was a focal abnormality in the liver that was seen in 2019 and appears stable in size.  The stable size indicates that it is very unlikely to be cancerous.  However, given the atypical appearance of the liver, I would recommend we get an MRI to further characterize this lesion, and determine if biopsy or surveillance is warranted. ? ?[name],  ?Can you please order an MRI liver with contrast to further evaluate the left hepatic lobe lesion seen on CT and [place]?

## 2021-09-07 ENCOUNTER — Other Ambulatory Visit: Payer: Self-pay

## 2021-09-07 DIAGNOSIS — K769 Liver disease, unspecified: Secondary | ICD-10-CM

## 2021-09-16 ENCOUNTER — Encounter: Payer: Self-pay | Admitting: Gastroenterology

## 2021-09-18 ENCOUNTER — Ambulatory Visit (HOSPITAL_COMMUNITY)
Admission: RE | Admit: 2021-09-18 | Discharge: 2021-09-18 | Disposition: A | Discharge status: Home / Self Care | Payer: Managed Care, Other (non HMO) | Source: Ambulatory Visit | Attending: Gastroenterology | Admitting: Gastroenterology

## 2021-09-18 ENCOUNTER — Ambulatory Visit (HOSPITAL_COMMUNITY): Admission: RE | Admit: 2021-09-18 | Payer: Managed Care, Other (non HMO) | Source: Ambulatory Visit

## 2021-09-18 DIAGNOSIS — K769 Liver disease, unspecified: Secondary | ICD-10-CM

## 2021-09-21 ENCOUNTER — Ambulatory Visit: Payer: Managed Care, Other (non HMO) | Admitting: Family

## 2021-09-23 ENCOUNTER — Encounter: Payer: Self-pay | Admitting: Gastroenterology

## 2021-09-23 ENCOUNTER — Ambulatory Visit (AMBULATORY_SURGERY_CENTER): Payer: Managed Care, Other (non HMO) | Admitting: Gastroenterology

## 2021-09-23 VITALS — BP 108/61 | HR 70 | Temp 98.2°F | Resp 17 | Ht 69.0 in | Wt 280.0 lb

## 2021-09-23 DIAGNOSIS — D122 Benign neoplasm of ascending colon: Secondary | ICD-10-CM | POA: Diagnosis not present

## 2021-09-23 DIAGNOSIS — K921 Melena: Secondary | ICD-10-CM

## 2021-09-23 DIAGNOSIS — D128 Benign neoplasm of rectum: Secondary | ICD-10-CM

## 2021-09-23 DIAGNOSIS — Z1211 Encounter for screening for malignant neoplasm of colon: Secondary | ICD-10-CM | POA: Diagnosis present

## 2021-09-23 DIAGNOSIS — K621 Rectal polyp: Secondary | ICD-10-CM

## 2021-09-23 MED ORDER — SODIUM CHLORIDE 0.9 % IV SOLN: 500.0000 mL | Freq: Once | INTRAVENOUS | Status: DC

## 2021-09-23 NOTE — Progress Notes (Signed)
PT taken to PACU. Monitors in place. VSS. Report given to RN. 

## 2021-09-23 NOTE — Op Note (Signed)
[place] ?Patient Name: Jon Wade ?Procedure Date: 09/23/2021 3:31 PM ?MRN: [id number] ?Endoscopist: Kayren Holck E. Candis Schatz , MD ?Age: 51 ?Referring MD:  ?Date of Birth: 11-Jun-1970 ?Gender: Male ?Account #: [id number] ?Procedure:                Colonoscopy ?Indications:              Screening for colorectal malignant neoplasm, This  ?                          is the patient's first colonoscopy ?Medicines:                Monitored Anesthesia Care ?Procedure:                Pre-Anesthesia Assessment: ?                          - Prior to the procedure, a History and Physical  ?                          was performed, and patient medications and  ?                          allergies were reviewed. The patient's tolerance of  ?                          previous anesthesia was also reviewed. The risks  ?                          and benefits of the procedure and the sedation  ?                          options and risks were discussed with the patient.  ?                          All questions were answered, and informed consent  ?                          was obtained. Prior Anticoagulants: The patient has  ?                          taken no previous anticoagulant or antiplatelet  ?                          agents. ASA Grade Assessment: III - A patient with  ?                          severe systemic disease. After reviewing the risks  ?                          and benefits, the patient was deemed in  ?                          satisfactory condition to undergo the procedure. ?  After obtaining informed consent, the colonoscope  ?                          was passed under direct vision. Throughout the  ?                          procedure, the patient's blood pressure, pulse, and  ?                          oxygen saturations were monitored continuously. The  ?                          Olympus [id number] was introduced through the anus  ?                          and advanced to the  the terminal ileum, with  ?                          identification of the appendiceal orifice and IC  ?                          valve. The colonoscopy was somewhat difficult due  ?                          to poor endoscopic visualization secondary to  ?                          difficult insufflation, excessive peristalsis and a  ?                          redundant colon. Successful completion of the  ?                          procedure was aided by using manual pressure. The  ?                          patient tolerated the procedure well. The quality  ?                          of the bowel preparation was adequate. The terminal  ?                          ileum, ileocecal valve, appendiceal orifice, and  ?                          rectum were photographed. The bowel preparation  ?                          used was Sutab via split dose instruction. ?Scope In: 4:00:41 PM ?Scope Out: 4:26:53 PM ?Scope Withdrawal Time: 0 hours 22 minutes 39 seconds  ?Total Procedure Duration: 0 hours 26 minutes 12 seconds  ?Findings:                 The perianal exam findings include  ?  scarring/hypopigmentation adjacent to the anus.  ?                          There was no fistulous tract identified. No  ?                          swelling, redness or induration ?                          The digital rectal exam was normal. Pertinent  ?                          negatives include normal sphincter tone and no  ?                          palpable rectal lesions. ?                          A 3 mm polyp was found in the ascending colon. The  ?                          polyp was sessile. The polyp was removed with a  ?                          cold snare. Resection and retrieval were complete.  ?                          Estimated blood loss was minimal. ?                          Many flat and sessile polyps were found in the  ?                          rectum and sigmoid colon. The polyps were 1 to 3 mm  ?                           in size. These polyps were removed with a cold  ?                          snare. Resection and retrieval were complete.  ?                          Estimated blood loss was minimal. ?                          A few small-mouthed diverticula were found in the  ?                          sigmoid colon and descending colon. ?                          The exam was otherwise normal throughout the  ?  examined colon. ?                          The terminal ileum appeared normal. ?                          Non-bleeding internal hemorrhoids were found during  ?                          retroflexion. The hemorrhoids were Grade I  ?                          (internal hemorrhoids that do not prolapse). ?                          No additional abnormalities were found on  ?                          retroflexion. ?Complications:            No immediate complications. ?Estimated Blood Loss:     Estimated blood loss was minimal. ?Impression:               - Scarring found on perianal exam. ?                          - One 3 mm polyp in the ascending colon, removed  ?                          with a cold snare. Resected and retrieved. ?                          - Many 1 to 3 mm polyps in the rectum and in the  ?                          sigmoid colon, removed with a cold snare. Resected  ?                          and retrieved. ?                          - Diverticulosis in the sigmoid colon and in the  ?                          descending colon. ?                          - The examined portion of the ileum was normal. ?                          - Non-bleeding internal hemorrhoids. ?Recommendation:           - Patient has a contact number available for  ?                          emergencies. The signs and symptoms of potential  ?  delayed complications were discussed with the  ?                          patient. Return to normal activities tomorrow.  ?                           Written discharge instructions were provided to the  ?                          patient. ?                          - Resume previous diet. ?                          - Continue present medications. ?                          - Await pathology results. ?                          - Repeat colonoscopy (date not yet determined) for  ?                          surveillance based on pathology results. ?Ayrton Mcvay E. Candis Schatz, MD ?09/23/2021 4:38:27 PM ?This report has been signed electronically. ?

## 2021-09-23 NOTE — Progress Notes (Signed)
Called to room to assist during endoscopic procedure.  Patient ID and intended procedure confirmed with present staff. Received instructions for my participation in the procedure from the performing physician.  

## 2021-09-23 NOTE — Patient Instructions (Signed)
Please read handouts provided. Continue present medications. Await pathology results.   YOU HAD AN ENDOSCOPIC PROCEDURE TODAY AT THE [facility]:   Refer to the procedure report that was given to you for any specific questions about what was found during the examination.  If the procedure report does not answer your questions, please call your gastroenterologist to clarify.  If you requested that your care partner not be given the details of your procedure findings, then the procedure report has been included in a sealed envelope for you to review at your convenience later.  YOU SHOULD EXPECT: Some feelings of bloating in the abdomen. Passage of more gas than usual.  Walking can help get rid of the air that was put into your GI tract during the procedure and reduce the bloating. If you had a lower endoscopy (such as a colonoscopy or flexible sigmoidoscopy) you may notice spotting of blood in your stool or on the toilet paper. If you underwent a bowel prep for your procedure, you may not have a normal bowel movement for a few days.  Please Note:  You might notice some irritation and congestion in your nose or some drainage.  This is from the oxygen used during your procedure.  There is no need for concern and it should clear up in a day or so.  SYMPTOMS TO REPORT IMMEDIATELY:  Following lower endoscopy (colonoscopy or flexible sigmoidoscopy):  Excessive amounts of blood in the stool  Significant tenderness or worsening of abdominal pains  Swelling of the abdomen that is new, acute  Fever of 100F or higher   For urgent or emergent issues, a gastroenterologist can be reached at any hour by calling [phone]. Do not use MyChart messaging for urgent concerns.    DIET:  We do recommend a small meal at first, but then you may proceed to your regular diet.  Drink plenty of fluids but you should avoid alcoholic beverages for 24 hours.  ACTIVITY:  You should plan to take it easy  for the rest of today and you should NOT DRIVE or use heavy machinery until tomorrow (because of the sedation medicines used during the test).    FOLLOW UP: Our staff will call the number listed on your records 48-72 hours following your procedure to check on you and address any questions or concerns that you may have regarding the information given to you following your procedure. If we do not reach you, we will leave a message.  We will attempt to reach you two times.  During this call, we will ask if you have developed any symptoms of COVID 19. If you develop any symptoms (ie: fever, flu-like symptoms, shortness of breath, cough etc.) before then, please call [phone].  If you test positive for Covid 19 in the 2 weeks post procedure, please call and report this information to us.    If any biopsies were taken you will be contacted by phone or by letter within the next 1-3 weeks.  Please call us at [phone] if you have not heard about the biopsies in 3 weeks.    SIGNATURES/CONFIDENTIALITY: You and/or your care partner have signed paperwork which will be entered into your electronic medical record.  These signatures attest to the fact that that the information above on your After Visit Summary has been reviewed and is understood.  Full responsibility of the confidentiality of this discharge information lies with you and/or your care-partner.  

## 2021-09-23 NOTE — Progress Notes (Signed)
History and Physical Interval Note: ? ?09/23/2021 ?3:49 PM ? ?[facility]  has presented today for endoscopic procedure(s), with the diagnosis of  ?Encounter Diagnosis  ?Name Primary?  ? Hematochezia Yes  ?Marland Kitchen  The various methods of evaluation and treatment have been discussed with the patient and/or family. After consideration of risks, benefits and other options for treatment, the patient has consented to  the endoscopic procedure(s). ? ? The patient's history has been reviewed, patient examined, no change in status, stable for endoscopic procedure(s).  I have reviewed the patient's chart and labs.  Questions were answered to the patient's satisfaction.   ? ? ?Nahmir Zeidman E. Candis Schatz, MD ?[facility] ? ?

## 2021-09-25 ENCOUNTER — Telehealth: Payer: Self-pay | Admitting: *Deleted

## 2021-09-25 NOTE — Telephone Encounter (Signed)
?  Follow up Call- ? ? ?  09/23/2021  ?  3:00 PM  ?Call back number  ?Post procedure Call Back phone  # [phone]  ?Permission to leave phone message Yes  ?  ? ?Patient questions: ?Message left to call us if necessary. ?

## 2021-09-25 NOTE — Telephone Encounter (Signed)
?  Follow up Call- ? ? ?  09/23/2021  ?  3:00 PM  ?Call back number  ?Post procedure Call Back phone  # [phone]  ?Permission to leave phone message Yes  ?  ? ?Patient questions: ? ?Message left to call if necessary. ?

## 2021-09-30 ENCOUNTER — Other Ambulatory Visit: Payer: Managed Care, Other (non HMO)

## 2021-09-30 DIAGNOSIS — F1010 Alcohol abuse, uncomplicated: Secondary | ICD-10-CM

## 2021-09-30 DIAGNOSIS — K921 Melena: Secondary | ICD-10-CM

## 2021-10-01 ENCOUNTER — Ambulatory Visit (INDEPENDENT_AMBULATORY_CARE_PROVIDER_SITE_OTHER): Payer: Commercial Managed Care - HMO | Admitting: Family

## 2021-10-01 ENCOUNTER — Encounter: Payer: Self-pay | Admitting: Family

## 2021-10-01 VITALS — BP 142/83 | HR 75 | Temp 98.2°F | Ht 69.0 in | Wt 282.0 lb

## 2021-10-01 DIAGNOSIS — F32A Depression, unspecified: Secondary | ICD-10-CM

## 2021-10-01 DIAGNOSIS — F419 Anxiety disorder, unspecified: Secondary | ICD-10-CM | POA: Diagnosis not present

## 2021-10-01 MED ORDER — VENLAFAXINE HCL ER 37.5 MG PO CP24: 37.5000 mg | ORAL_CAPSULE | Freq: Every day | ORAL | 0 refills | Status: DC

## 2021-10-01 NOTE — Progress Notes (Signed)
Jon Wade, ?One polyp which I removed during your recent procedure was proven to be completely benign but is considered a "pre-cancerous" polyp that MAY have grown into cancer if it had not been removed.  Studies shows that at least 20% of women over age 52 and 30% of men over age 87 have pre-cancerous polyps.  Based on current nationally recognized surveillance guidelines, I recommend that you have a repeat colonoscopy in 7 years. ?The small polyps in the left side of the colon which were removed were consistent with hyperplastic polyps these polyps are not precancerous and do not affect your risk of colon cancer. ? ?If you develop any new rectal bleeding, abdominal pain or significant bowel habit changes, please contact me before then. ? ?

## 2021-10-01 NOTE — Assessment & Plan Note (Addendum)
Buspar & Vistaril started last visit - Vistaril causes too much drowsiness, even '10mg'$  dose, and Buspar is causing more depression, especially with trying to reduce his drinking at night, which makes him more irritable and sad. States he remembers now that Effexor helped him the most. Sending low dose, advised on use & SE, f/u in 1 mo. ?

## 2021-10-01 NOTE — Progress Notes (Signed)
? ?Subjective:  ? ? ? Patient ID: Jon Wade, male    DOB: 1971-04-22, 51 y.o.   MRN: [id number] ? ?Chief Complaint  ?Patient presents with  ? Anxiety  ?  Pt states his anxiety has been up and down.   ? Depression  ?  Discuss Effexor.   ? ?HPI: ?Anxiety/Depression: Patient complains of anxiety disorder and depression.   ?He has the following symptoms: difficulty concentrating, fatigue, irritable, palpitations, racing thoughts.  ?Onset of symptoms was approximately  years ago, He denies current suicidal and homicidal ideation.  ?Possible organic causes contributing are: none.  ?Risk factors: negative life event : 2 divorces and previous episode of depression  ?Previous treatment includes Prozac and Wellbutrin and individual therapy.  He complains of the following side effects from the treatment: felt no emotion, stuttering was worse. Reports trying Bupropion given last visit, but had nightmares, "weird" feeling, near-syncopal feeling. Buspar & Vistaril started last visit - Vistaril causes too much drowsiness, even '10mg'$  dose, and Buspar is causing more depression, especially with trying to reduce his drinking at night. ? ?Assessment & Plan:  ? ?Problem List Items Addressed This Visit   ? ?  ? Other  ? Anxiety and depression - Primary  ?  Buspar & Vistaril started last visit - Vistaril causes too much drowsiness, even '10mg'$  dose, and Buspar is causing more depression, especially with trying to reduce his drinking at night, which makes him more irritable and sad. States he remembers now that Effexor helped him the most. Sending low dose, advised on use & SE, f/u in 1 mo. ? ?  ?  ? Relevant Medications  ? venlafaxine XR (EFFEXOR XR) 37.5 MG 24 hr capsule  ? ? ?Outpatient Medications Prior to Visit  ?Medication Sig Dispense Refill  ? hydrOXYzine (ATARAX) 10 MG tablet Take 1-2 tablets (10-20 mg total) by mouth as needed for anxiety. Take in the evenings if needed to calm your anxiety. 30 tablet 0  ? busPIRone (BUSPAR)  5 MG tablet Take 1-2 tablets (5-10 mg total) by mouth 2 (two) times daily. 60 tablet 0  ? MILK THISTLE PO Take 1 tablet by mouth daily.    ? Turmeric Curcumin 500 MG CAPS Take by mouth.    ? ?No facility-administered medications prior to visit.  ? ? ?Past Medical History:  ?Diagnosis Date  ? Anal fistula   ? Anxiety   ? Arthritis   ? "hands maybe" (11/26/2014)  ? Depression   ? GERD (gastroesophageal reflux disease)   ? Heavy smoker   ? 1ppd  ? Sepsis ([place]) 11/26/2014  ? Stutter   ? ? ?Past Surgical History:  ?Procedure Laterality Date  ? ANAL FISTULOTOMY N/A 08/03/2017  ? Procedure: EXAM UNDER ANESTHESIA WITH ANAL FISTULOTOMY;  Surgeon: Coralie Keens, MD;  Location: [facility];  Service: General;  Laterality: N/A;  ? INCISION AND DRAINAGE ABSCESS Left 11/26/2014  ? buttocks  ? IRRIGATION AND DEBRIDEMENT BUTTOCKS Left 11/26/2014  ? Procedure: Incision and Drainage of Left Buttock Abscess;  Surgeon: Donnie Mesa, MD;  Location: [facility];  Service: General;  Laterality: Left;  ? KNEE ARTHROSCOPY Right 1988/1989  ? ? ?No Known Allergies ? ?   ?Objective:  ?  ?Physical Exam ?Vitals and nursing note reviewed.  ?Constitutional:   ?   General: He is not in acute distress. ?   Appearance: Normal appearance.  ?HENT:  ?   Head: Normocephalic.  ?Cardiovascular:  ?   Rate and Rhythm:  Normal rate and regular rhythm.  ?Pulmonary:  ?   Effort: Pulmonary effort is normal.  ?   Breath sounds: Normal breath sounds.  ?Musculoskeletal:     ?   General: Normal range of motion.  ?   Cervical back: Normal range of motion.  ?Skin: ?   General: Skin is warm and dry.  ?Neurological:  ?   Mental Status: He is alert and oriented to person, place, and time.  ?Psychiatric:     ?   Mood and Affect: Mood normal.  ? ? ?BP (!) 142/83 (BP Location: Left Arm, Patient Position: Sitting, Cuff Size: Large)   Pulse 75   Temp 98.2 ?F (36.8 ?C) (Temporal)   Ht '5\' 9"'$  (1.753 m)   Wt 282 lb (127.9 kg)   SpO2 93%   BMI 41.64 kg/m?  ?Wt  Readings from Last 3 Encounters:  ?10/01/21 282 lb (127.9 kg)  ?09/23/21 280 lb (127 kg)  ?08/25/21 280 lb 2 oz (127.1 kg)  ? ? ?   ? ?Jeanie Sewer, NP ? ?

## 2021-10-02 LAB — H. PYLORI ANTIGEN, STOOL: H pylori Ag, Stl: NEGATIVE

## 2021-10-06 ENCOUNTER — Ambulatory Visit (HOSPITAL_COMMUNITY)
Admission: RE | Admit: 2021-10-06 | Discharge: 2021-10-06 | Disposition: A | Discharge status: Home / Self Care | Payer: Commercial Managed Care - HMO | Source: Ambulatory Visit | Attending: Gastroenterology | Admitting: Gastroenterology

## 2021-10-06 DIAGNOSIS — K769 Liver disease, unspecified: Secondary | ICD-10-CM | POA: Diagnosis present

## 2021-10-06 MED ORDER — GADOBUTROL 1 MMOL/ML IV SOLN
10.0000 mL | Freq: Once | INTRAVENOUS | Status: AC | PRN
  Administered 2021-10-06: 10 mL via INTRAVENOUS

## 2021-10-13 NOTE — Progress Notes (Signed)
Mr. Bacha,  ?The liver lesion seen on ultrasound was confirmed to be a hemangioma.  This is a common and completely benign vascular growth in the liver.  It has no precancerous potential and does not cause any symptoms or liver disease.  No further surveillance or evaluation is needed.   ?The MRI also confirmed fatty change of the liver secondary to alcohol use.  Please continue to reduce/eliminate alcohol consumption to reduce the risk of more significant liver disease in the future. ?

## 2021-10-29 ENCOUNTER — Other Ambulatory Visit: Payer: Self-pay | Admitting: Family

## 2021-10-29 DIAGNOSIS — F32A Depression, unspecified: Secondary | ICD-10-CM

## 2021-10-29 NOTE — Telephone Encounter (Signed)
He has an appointment with me on June 5 will he run out of the Effexor before then?  Also, he said the hydroxyzine makes him too drowsy, he now wants this refilled?

## 2021-10-30 NOTE — Telephone Encounter (Signed)
ok to refill the hydroxyzine, thx

## 2021-10-30 NOTE — Telephone Encounter (Signed)
Lvm for pt in regards to medication refills.

## 2021-11-02 ENCOUNTER — Ambulatory Visit (INDEPENDENT_AMBULATORY_CARE_PROVIDER_SITE_OTHER): Payer: Commercial Managed Care - HMO | Admitting: Family

## 2021-11-02 ENCOUNTER — Encounter: Payer: Self-pay | Admitting: Family

## 2021-11-02 VITALS — BP 135/89 | HR 63 | Temp 97.7°F | Ht 69.0 in | Wt 292.0 lb

## 2021-11-02 DIAGNOSIS — F1010 Alcohol abuse, uncomplicated: Secondary | ICD-10-CM | POA: Diagnosis not present

## 2021-11-02 DIAGNOSIS — F419 Anxiety disorder, unspecified: Secondary | ICD-10-CM | POA: Diagnosis not present

## 2021-11-02 DIAGNOSIS — K700 Alcoholic fatty liver: Secondary | ICD-10-CM | POA: Insufficient documentation

## 2021-11-02 DIAGNOSIS — F32A Depression, unspecified: Secondary | ICD-10-CM | POA: Diagnosis not present

## 2021-11-02 MED ORDER — VENLAFAXINE HCL ER 75 MG PO CP24: 75.0000 mg | ORAL_CAPSULE | Freq: Every day | ORAL | 0 refills | Status: DC

## 2021-11-02 NOTE — Assessment & Plan Note (Signed)
Chronic - started Effexor last visit and doing well, wants to increase dose today. Reports drinking down to 2-3 nightly. Sending refill, f/u in 3 mos.

## 2021-11-02 NOTE — Assessment & Plan Note (Addendum)
Chronic - pt reports he is drinking less per night, but still 2-3 beers per night, states if he doesn't drink at all he gets shaky. When asked directly if he is addicted he says yes. Advised pt on AA meetings, handout provided with local meeting info.

## 2021-11-02 NOTE — Patient Instructions (Addendum)
It was very nice to see you today!  I have sent a refill of your Effexor.  Schedule a 3 month follow up visit today.  To find out more about an Alcoholics Anonymous (AA) meeting, go to this website, http://young.com/ and it will pull up all the local meetings in [place] and you can enter the day and time even that you would be interested in.  See handouts attached also to learn more.    PLEASE NOTE:  If you had any lab tests please let us know if you have not heard back within a few days. You may see your results on MyChart before we have a chance to review them but we will give you a call once they are reviewed by [place]. If we ordered any referrals today, please let us know if you have not heard from their office within the next week.

## 2021-11-02 NOTE — Progress Notes (Signed)
Subjective:     Patient ID: Jon Wade, male    DOB: [birth year], 51 y.o.   MRN: [id number]  Chief Complaint  Patient presents with   Anxiety and Depression    Pt states it has been a lot better. Refill and discuss dose increase   HPI: Anxiety/Depression: Patient complains of anxiety disorder and depression.   He has the following symptoms: difficulty concentrating, fatigue, irritable, palpitations, racing thoughts. Onset of symptoms was approximately  years ago, He denies current suicidal and homicidal ideation. Possible organic causes contributing are: none. Risk factors: negative life event: 2 divorces and previous episode of depression. Previous treatment includes Prozac and Wellbutrin and individual therapy.  He complains of the following side effects from the treatment: felt no emotion, stuttering was worse. Reports last treated 14 years ago, states he currently is self-medicating with drinks 4-6 beers nightly. Has trouble with emotional connections, has a current fiance. States he is self-employed. Started Effexor last visit and tolerating. Alcohol Abuse:  pt reports drinking 4-6 beers per night for years, self-medicating his anxiety and depression. Hx of 2 failed marriages and currently engaged. Denies drinking during day or waking up craving alcohol. Denies hx of peptic ulcers, hematemesis, melena, or abdominal pain.  Assessment & Plan:   Problem List Items Addressed This Visit       Other   Alcohol abuse - Primary    Chronic - pt reports he is drinking less per night, but still 2-3 beers per night, states if he doesn't drink at all he gets shaky. When asked directly if he is addicted he says yes. Advised pt on AA meetings, handout provided with local meeting info.       Anxiety and depression    Chronic - started Effexor last visit and doing well, wants to increase dose today. Reports drinking down to 2-3 nightly. Sending refill, f/u in 3 mos.       Relevant  Medications   venlafaxine XR (EFFEXOR XR) 75 MG 24 hr capsule    Outpatient Medications Prior to Visit  Medication Sig Dispense Refill   hydrOXYzine (ATARAX) 10 MG tablet TAKE 1 TO 2 TABLETS BY MOUTH IN THE EVENINGS IF NEEDED TO CALM YOUR ANXIETY 30 tablet 5   venlafaxine XR (EFFEXOR XR) 37.5 MG 24 hr capsule Take 1 capsule (37.5 mg total) by mouth daily with breakfast. 30 capsule 0   No facility-administered medications prior to visit.    Past Medical History:  Diagnosis Date   Anal fistula    Anxiety    Arthritis    "hands maybe" (11/26/2014)   Depression    GERD (gastroesophageal reflux disease)    Heavy smoker    1ppd   Sepsis ([place]) 11/26/2014   Stutter     Past Surgical History:  Procedure Laterality Date   ANAL FISTULOTOMY N/A 08/03/2017   Procedure: EXAM UNDER ANESTHESIA WITH ANAL FISTULOTOMY;  Surgeon: Coralie Keens, MD;  Location: [facility];  Service: General;  Laterality: N/A;   INCISION AND DRAINAGE ABSCESS Left 11/26/2014   buttocks   IRRIGATION AND DEBRIDEMENT BUTTOCKS Left 11/26/2014   Procedure: Incision and Drainage of Left Buttock Abscess;  Surgeon: Donnie Mesa, MD;  Location: [facility];  Service: General;  Laterality: Left;   KNEE ARTHROSCOPY Right 1988/1989    No Known Allergies     Objective:    Physical Exam Vitals and nursing note reviewed.  Constitutional:      General: He is not in acute  distress.    Appearance: Normal appearance. He is obese.  HENT:     Head: Normocephalic.  Cardiovascular:     Rate and Rhythm: Normal rate and regular rhythm.  Pulmonary:     Effort: Pulmonary effort is normal.     Breath sounds: Normal breath sounds.  Musculoskeletal:        General: Normal range of motion.     Cervical back: Normal range of motion.  Skin:    General: Skin is warm and dry.  Neurological:     Mental Status: He is alert and oriented to person, place, and time.  Psychiatric:        Mood and Affect: Mood normal.     BP 135/89 (BP Location: Left Arm, Patient Position: Sitting, Cuff Size: Large)   Pulse 63   Temp 97.7 F (36.5 C) (Temporal)   Ht '5\' 9"'$  (1.753 m)   Wt 292 lb (132.5 kg)   SpO2 97%   BMI 43.12 kg/m  Wt Readings from Last 3 Encounters:  11/02/21 292 lb (132.5 kg)  10/01/21 282 lb (127.9 kg)  09/23/21 280 lb (127 kg)        Meds ordered this encounter  Medications   venlafaxine XR (EFFEXOR XR) 75 MG 24 hr capsule    Sig: Take 1 capsule (75 mg total) by mouth daily with breakfast.    Dispense:  90 capsule    Refill:  0    D/C Buspar    Jeanie Sewer, NP

## 2021-12-08 ENCOUNTER — Encounter: Payer: Self-pay | Admitting: Family

## 2021-12-09 ENCOUNTER — Encounter: Payer: Self-pay | Admitting: Family Medicine

## 2021-12-09 ENCOUNTER — Ambulatory Visit (INDEPENDENT_AMBULATORY_CARE_PROVIDER_SITE_OTHER): Payer: Commercial Managed Care - HMO | Admitting: Family Medicine

## 2021-12-09 VITALS — BP 130/78 | HR 85 | Temp 97.8°F | Ht 69.0 in | Wt 285.8 lb

## 2021-12-09 DIAGNOSIS — M25562 Pain in left knee: Secondary | ICD-10-CM

## 2021-12-09 DIAGNOSIS — M109 Gout, unspecified: Secondary | ICD-10-CM

## 2021-12-09 MED ORDER — MELOXICAM 15 MG PO TABS: 15.0000 mg | ORAL_TABLET | Freq: Every day | ORAL | 0 refills | Status: DC

## 2021-12-09 NOTE — Progress Notes (Signed)
   Jon Wade is a 51 y.o. male who presents today for an office visit.  Assessment/Plan:  Left Knee Pain Concern for possible meniscal tear based on history and physical exam.  He may also have a mild MCL strain as well.  Do not think this is a gout flare based on his history and known mechanism of injury.  We will treat with course of meloxicam.  Also recommended ice as needed.  Discussed knee brace.  We discussed home exercise program and handout was given.  He will let me know if not improving by next week and we can refer to sports medicine in that case.    Subjective:  HPI:  Patient here with left knee pain.  Started about 2-1/2 weeks ago.  Patient notes that he was moving furniture downstairs and slipped on the stairs.  Did not fall but did feel like he hyperextended his left knee at that time.  Did not have any issues at that time however the next day he started noticing increasing pain and swelling to the knee.  He was concerned about possible gout flare as he has had gout in his ankles before.  He tried using topical Tiger balm which has not helped.  Pain is persisted for the last 2-1/2 weeks.  Overall stable since that time.  Occasionally gets a popping sensation since the accident.  No buckling.  No other injuries.  No specific treatments tried.      Objective:  Physical Exam: BP 130/78   Pulse 85   Temp 97.8 F (36.6 C) (Temporal)   Ht '5\' 9"'$  (1.753 m)   Wt 285 lb 12.8 oz (129.6 kg)   SpO2 96%   BMI 42.21 kg/m   Gen: No acute distress, resting comfortably MSK: - Left Knee: Gross effusion noted.  Tenderness palpation on along the medial joint line.  Crepitus and palpable pop noted with active and passive extension.  McMurray's sign positive.  Stable to varus and valgus stress.  Anterior posterior drawer signs negative. Neuro: Grossly normal, moves all extremities Psych: Normal affect and thought content      [name], MD 12/09/2021 11:31 AM

## 2021-12-09 NOTE — Patient Instructions (Signed)
It was very nice to see you today!  I think you may have torn your meniscus.  You may also have a mild ligament strain.  Please work on the exercises.  Take the anti-inflammatory.  Use ice as needed.  Let me know if not improving by next week.  Take care, Dr Jerline Pain  PLEASE NOTE:  If you had any lab tests please let us know if you have not heard back within a few days. You may see your results on mychart before we have a chance to review them but we will give you a call once they are reviewed by [place]. If we ordered any referrals today, please let us know if you have not heard from their office within the next week.   Please try these tips to maintain a healthy lifestyle:  Eat at least 3 REAL meals and 1-2 snacks per day.  Aim for no more than 5 hours between eating.  If you eat breakfast, please do so within one hour of getting up.   Each meal should contain half fruits/vegetables, one quarter protein, and one quarter carbs (no bigger than a computer mouse)  Cut down on sweet beverages. This includes juice, soda, and sweet tea.   Drink at least 1 glass of water with each meal and aim for at least 8 glasses per day  Exercise at least 150 minutes every week.

## 2021-12-31 ENCOUNTER — Encounter: Payer: Self-pay | Admitting: Family

## 2021-12-31 ENCOUNTER — Ambulatory Visit (INDEPENDENT_AMBULATORY_CARE_PROVIDER_SITE_OTHER): Payer: Commercial Managed Care - HMO | Admitting: Family

## 2021-12-31 VITALS — BP 144/85 | HR 69 | Temp 98.4°F | Ht 69.0 in | Wt 295.8 lb

## 2021-12-31 DIAGNOSIS — R609 Edema, unspecified: Secondary | ICD-10-CM | POA: Diagnosis not present

## 2021-12-31 DIAGNOSIS — R2231 Localized swelling, mass and lump, right upper limb: Secondary | ICD-10-CM

## 2021-12-31 DIAGNOSIS — R739 Hyperglycemia, unspecified: Secondary | ICD-10-CM | POA: Diagnosis not present

## 2021-12-31 DIAGNOSIS — M25562 Pain in left knee: Secondary | ICD-10-CM

## 2021-12-31 NOTE — Progress Notes (Signed)
Patient ID: Jon Wade, male    DOB: 08-Oct-1970, 51 y.o.   MRN: [id number]  Chief Complaint  Patient presents with   Hand Pain    Pt c/o right hand swelling and pain in right thumb up wrist for about a week. Pt states he was working and he felt a shock in his right thumb and the next day hand was swelling.     HPI: Swollen hand:  starting hurting a week ago, noticed after he had been using a shovel at work repeatedly and felt a sharp tingling sensation go from his wrist down to this thumb and had noticeable swelling in thumb, top of hand and part of palm next to his thumb. Has limited ROM in thumb, but does not have constant pain. Left knee pain: Started about 4 weeks ago.  Patient states he was moving furniture downstairs and slipped on the stairs.  Did not fall but did feel like he hyperextended his left knee.  Did not have any issues at that time however the next day he started noticing increasing pain and swelling to the knee.  He was concerned about possible gout flare as he has had gout in his ankles before.  Seen by provider in office and reassured no gout, but possible meniscus or other ligament strain. Denies any instability in the joint. Denies any hx of knee problems. Was given Mobic to try but reports he stopped taking after a few days due to stomach pain/cramping. Fluid retention:  pt reports feeling bloated all over, face & neck, low back, hands, knees, legs, ankles, feet. 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 Denies increase in high sodium foods.  Assessment & Plan:  1. Elevated blood sugar level  - HgB A1c  2. Acute pain of left knee seen in office a few weeks ago and given Mobic, reports stomach pain after taking so he stopped. Advised taking 1/2 pill and after he eats something. Increase to 1/2 pill bid if no pain. Referring to sports med.  - Ambulatory referral to Sports  Medicine  3. Localized swelling on right hand advised to apply ice up to 50mn tid, possibly tendonitis vs. D'Quervain's, advised on limited use of hand, requires rest to heal, possibly a thumb brace, but wait until seen by specialist. Advised to let me know if still can't tolerate Mobic and will send Naproxen.  - Ambulatory referral to Sports Medicine  4. Fluid retention overall bloating, weight is up 10lbs in 3 weeks. works outside drinking a lot of water, denies increased sodium in diet. Advised on starting water intake early in am and not drinking in large amounts later in day. Discussed signs of over hydration with water. BP slightly high today, denies HA or dizziness.  Reports urine darker color, denies any urinary sx, no abdominal pain. Checking kidney fx and electrolytes today.  - Comp Met (CMET)    Subjective:    Outpatient Medications Prior to Visit  Medication Sig Dispense Refill   hydrOXYzine (ATARAX) 10 MG tablet TAKE 1 TO 2 TABLETS BY MOUTH IN THE EVENINGS IF NEEDED TO CALM YOUR ANXIETY 30 tablet 5   venlafaxine XR (EFFEXOR XR) 75 MG 24 hr capsule Take 1 capsule (75 mg total) by mouth daily with breakfast. 90 capsule 0   meloxicam (MOBIC) 15 MG tablet Take 1 tablet (15 mg total) by mouth daily. (Patient not taking: Reported  on 12/31/2021) 30 tablet 0   No facility-administered medications prior to visit.   Past Medical History:  Diagnosis Date   Anal fistula    Anxiety    Arthritis    "hands maybe" (11/26/2014)   Depression    GERD (gastroesophageal reflux disease)    Heavy smoker    1ppd   Sepsis ([place]) 11/26/2014   Stutter    Past Surgical History:  Procedure Laterality Date   ANAL FISTULOTOMY N/A 08/03/2017   Procedure: EXAM UNDER ANESTHESIA WITH ANAL FISTULOTOMY;  Surgeon: Coralie Keens, MD;  Location: [facility];  Service: General;  Laterality: N/A;   INCISION AND DRAINAGE ABSCESS Left 11/26/2014   buttocks   IRRIGATION AND DEBRIDEMENT  BUTTOCKS Left 11/26/2014   Procedure: Incision and Drainage of Left Buttock Abscess;  Surgeon: Donnie Mesa, MD;  Location: [facility];  Service: General;  Laterality: Left;   KNEE ARTHROSCOPY Right 1988/1989   No Known Allergies    Objective:    Physical Exam Vitals and nursing note reviewed.  Constitutional:      General: He is not in acute distress.    Appearance: Normal appearance.  HENT:     Head: Normocephalic.  Cardiovascular:     Rate and Rhythm: Normal rate and regular rhythm.  Pulmonary:     Effort: Pulmonary effort is normal.     Breath sounds: Normal breath sounds.  Musculoskeletal:     Right hand: Swelling and tenderness present. Decreased range of motion.     Cervical back: Normal range of motion.     Left knee: Swelling present. Decreased range of motion. Tenderness present.  Skin:    General: Skin is warm and dry.  Neurological:     Mental Status: He is alert and oriented to person, place, and time.  Psychiatric:        Mood and Affect: Mood normal.    BP (!) 144/85 (BP Location: Left Arm, Patient Position: Sitting, Cuff Size: Large)   Pulse 69   Temp 98.4 F (36.9 C) (Temporal)   Ht 5' 9"  (1.753 m)   Wt 295 lb 12.8 oz (134.2 kg)   SpO2 98%   BMI 43.68 kg/m  Wt Readings from Last 3 Encounters:  12/31/21 295 lb 12.8 oz (134.2 kg)  12/09/21 285 lb 12.8 oz (129.6 kg)  11/02/21 292 lb (132.5 kg)       Jeanie Sewer, NP

## 2021-12-31 NOTE — Patient Instructions (Signed)
It was very nice to see you today!   I will review your lab results via MyChart in a few days.  I am sending a referral for your right thumb/hand swelling and left knee pain to our Sports medicine doctor.  Apply ice to your right hand for up to 75mnutes 3 times per day to reduce the swelling.  See the handout attached for more tips in care.  You can try to take 1/2 pill of the Meloxicam you have and see if this is better on your stomach. Be sure to eat something first.  If ok doing this for a few days, ok to increase to 1/2 pill twice a day and you may do better with this regimen.     PLEASE NOTE:  If you had any lab tests please let [place] if you have not heard back within a few days. You may see your results on MyChart before we have a chance to review them but we will give you a call once they are reviewed by [place] If we ordered any referrals today, please let [place] if you have not heard from their office within the next week.

## 2022-01-01 LAB — COMPREHENSIVE METABOLIC PANEL
ALT: 41 U/L (ref 0–53)
AST: 32 U/L (ref 0–37)
Albumin: 4.1 g/dL (ref 3.5–5.2)
Alkaline Phosphatase: 61 U/L (ref 39–117)
BUN: 15 mg/dL (ref 6–23)
CO2: 28 mEq/L (ref 19–32)
Calcium: 9.3 mg/dL (ref 8.4–10.5)
Chloride: 102 mEq/L (ref 96–112)
Creatinine, Ser: 0.94 mg/dL (ref 0.40–1.50)
GFR: 93.41 mL/min (ref >60.00)
Glucose, Bld: 119 mg/dL — ABNORMAL HIGH (ref 70–99)
Potassium: 3.9 mEq/L (ref 3.5–5.1)
Sodium: 137 mEq/L (ref 135–145)
Total Bilirubin: 0.5 mg/dL (ref 0.2–1.2)
Total Protein: 6.9 g/dL (ref 6.0–8.3)

## 2022-01-01 LAB — HEMOGLOBIN A1C: Hgb A1c MFr Bld: 5.9 % (ref 4.6–6.5)

## 2022-01-04 ENCOUNTER — Other Ambulatory Visit: Payer: Self-pay | Admitting: Family

## 2022-01-04 DIAGNOSIS — I10 Essential (primary) hypertension: Secondary | ICD-10-CM | POA: Insufficient documentation

## 2022-01-04 MED ORDER — HYDROCHLOROTHIAZIDE 12.5 MG PO TABS
12.5000 mg | ORAL_TABLET | Freq: Every morning | ORAL | 2 refills | Status: DC
Start: 2022-01-04 — End: 2022-02-02

## 2022-01-04 NOTE — Progress Notes (Signed)
Your labs look good.  Your A1C is just slightly high at 5.9, indicating borderline diabetes, but this would not explain all of the new swelling you are having. I want to start you on a diuretic to help get some of the extra fluid off - your weight was also up 10lbs in just a short time and your blood pressure slightly high, so this medicine will help get the fluid off and also help lower your BP a little. It is called Hydrochlorothiazide (HCTZ for short) - take 1 pill every morning, if you forget to take it is ok to take later, but know it will cause you to urinate more. Must be sure to drink at least 2 liters (1/2- 1 gallon) of water daily, but remember to not drink more than 1.5 gallons daily. Any questons, let me know.

## 2022-02-02 ENCOUNTER — Ambulatory Visit (INDEPENDENT_AMBULATORY_CARE_PROVIDER_SITE_OTHER): Payer: Commercial Managed Care - HMO | Admitting: Family

## 2022-02-02 ENCOUNTER — Encounter: Payer: Self-pay | Admitting: Family

## 2022-02-02 ENCOUNTER — Other Ambulatory Visit: Payer: Self-pay

## 2022-02-02 VITALS — BP 140/82 | HR 89 | Temp 97.7°F | Ht 69.0 in | Wt 296.4 lb

## 2022-02-02 DIAGNOSIS — I10 Essential (primary) hypertension: Secondary | ICD-10-CM | POA: Diagnosis not present

## 2022-02-02 DIAGNOSIS — R609 Edema, unspecified: Secondary | ICD-10-CM | POA: Diagnosis not present

## 2022-02-02 DIAGNOSIS — F32A Depression, unspecified: Secondary | ICD-10-CM

## 2022-02-02 DIAGNOSIS — F419 Anxiety disorder, unspecified: Secondary | ICD-10-CM | POA: Diagnosis not present

## 2022-02-02 MED ORDER — VENLAFAXINE HCL ER 75 MG PO CP24: 75.0000 mg | ORAL_CAPSULE | Freq: Every day | ORAL | 1 refills | Status: DC

## 2022-02-02 MED ORDER — OLMESARTAN MEDOXOMIL-HCTZ 20-12.5 MG PO TABS: 1.0000 | ORAL_TABLET | Freq: Every morning | ORAL | 1 refills | Status: DC

## 2022-02-02 NOTE — Assessment & Plan Note (Addendum)
   chronic  taking Effexor '75mg'$  qd, Hydroxyzine prn  reports dose is working well right now  sending refill  f/u in 6 mos

## 2022-02-02 NOTE — Assessment & Plan Note (Addendum)
   Chronic - not at goal  pt has been trying to lose weight and make lifestyle choices to reduce his blood pressure  started HCTZ last visit to also take off extra fluid, but no change in BP  adding Olmesartan today, pt advised on use & SE  f/u in 3 months or sooner if needed

## 2022-02-02 NOTE — Progress Notes (Signed)
Patient ID: Jon Wade, male    DOB: 09-Apr-1971, 51 y.o.   MRN: [id number]  Chief Complaint  Patient presents with   Follow-up    HPI: Anxiety/Depression: Patient complains of anxiety disorder and depression.   He has the following symptoms: difficulty concentrating, fatigue, irritable, palpitations, racing thoughts. Onset of symptoms was approximately  years ago, He denies current suicidal and homicidal ideation. Possible organic causes contributing are: none. Risk factors: negative life event: 2 divorces and previous episode of depression. Past treatment includes Prozac, Wellbutrin and individual therapy.  He complains of the following side effects from the treatment: felt no emotion, stuttering was worse. Reports last treated 14 years ago, states he was self-medicating with drinks 4-6 beers nightly. Has trouble with emotional connections, has a current fiance. States he is self-employed. Pt reports he is still doing well on Effexor '75mg'$  qd. Hypertension: Patient is currently maintained on the following medications for blood pressure: HCTZ Failed meds include: none Patient reports good compliance with blood pressure medications. Patient denies chest pain, headaches, shortness of breath or swelling. Last 3 blood pressure readings in our office are as follows: BP Readings from Last 3 Encounters:  02/02/22 (!) 140/82  12/31/21 (!) 144/85  12/09/21 130/78    Assessment & Plan:   Problem List Items Addressed This Visit       Cardiovascular and Mediastinum   Essential hypertension - Primary    Chronic - not at goal pt has been trying to lose weight and make lifestyle choices to reduce his blood pressure started HCTZ last visit to also take off extra fluid, but no change in BP adding Olmesartan today, pt advised on use & SE f/u in 3 months or sooner if needed      Relevant Medications   olmesartan-hydrochlorothiazide (BENICAR HCT) 20-12.5 MG tablet     Other   Anxiety and  depression    chronic taking Effexor '75mg'$  qd, Hydroxyzine prn reports dose is working well right now sending refill f/u in 6 mos      Relevant Medications   venlafaxine XR (EFFEXOR XR) 75 MG 24 hr capsule   Other Visit Diagnoses     Fluid retention    - pt reports doing better with HCTZ 12.'5mg'$  qd, he has noticed less swelling overall, especially his hands and feet. Weight did not go down, but maintained, he reports he ate more than usual of past holiday weekend. Advised to continue HCTZ, but adding Olmesartan as BP has remained elevated.      Subjective:    Outpatient Medications Prior to Visit  Medication Sig Dispense Refill   hydrOXYzine (ATARAX) 10 MG tablet TAKE 1 TO 2 TABLETS BY MOUTH IN THE EVENINGS IF NEEDED TO CALM YOUR ANXIETY 30 tablet 5   hydrochlorothiazide (HYDRODIURIL) 12.5 MG tablet Take 1 tablet (12.5 mg total) by mouth in the morning. 30 tablet 2   venlafaxine XR (EFFEXOR XR) 75 MG 24 hr capsule Take 1 capsule (75 mg total) by mouth daily with breakfast. 90 capsule 0   meloxicam (MOBIC) 15 MG tablet Take 1 tablet (15 mg total) by mouth daily. (Patient not taking: Reported on 12/31/2021) 30 tablet 0   No facility-administered medications prior to visit.   Past Medical History:  Diagnosis Date   Anal fistula    Anxiety    Arthritis    "hands maybe" (11/26/2014)   Depression    GERD (gastroesophageal reflux disease)    Heavy smoker    1ppd  Sepsis ([place]) 11/26/2014   Stutter    Past Surgical History:  Procedure Laterality Date   ANAL FISTULOTOMY N/A 08/03/2017   Procedure: EXAM UNDER ANESTHESIA WITH ANAL FISTULOTOMY;  Surgeon: Coralie Keens, MD;  Location: [facility];  Service: General;  Laterality: N/A;   INCISION AND DRAINAGE ABSCESS Left 11/26/2014   buttocks   IRRIGATION AND DEBRIDEMENT BUTTOCKS Left 11/26/2014   Procedure: Incision and Drainage of Left Buttock Abscess;  Surgeon: Donnie Mesa, MD;  Location: [facility];  Service: General;   Laterality: Left;   KNEE ARTHROSCOPY Right 1988/1989   No Known Allergies    Objective:    Physical Exam Vitals and nursing note reviewed.  Constitutional:      General: He is not in acute distress.    Appearance: Normal appearance. He is obese.  HENT:     Head: Normocephalic.  Cardiovascular:     Rate and Rhythm: Normal rate and regular rhythm.  Pulmonary:     Effort: Pulmonary effort is normal.     Breath sounds: Normal breath sounds.  Musculoskeletal:        General: Normal range of motion.     Cervical back: Normal range of motion.  Skin:    General: Skin is warm and dry.  Neurological:     Mental Status: He is alert and oriented to person, place, and time.  Psychiatric:        Mood and Affect: Mood normal.    BP (!) 140/82   Pulse 89   Temp 97.7 F (36.5 C)   Ht '5\' 9"'$  (1.753 m)   Wt 296 lb 6.4 oz (134.4 kg)   SpO2 95%   BMI 43.77 kg/m  Wt Readings from Last 3 Encounters:  02/02/22 296 lb 6.4 oz (134.4 kg)  12/31/21 295 lb 12.8 oz (134.2 kg)  12/09/21 285 lb 12.8 oz (129.6 kg)       Jeanie Sewer, NP

## 2022-05-02 NOTE — Progress Notes (Deleted)
Patient ID: Jon Wade, male    DOB: [birth year], 51 y.o.   MRN: [id number]  No chief complaint on file.   HPI: Anxiety/Depression: Patient complains of anxiety disorder and depression.   He has the following symptoms: difficulty concentrating, fatigue, irritable, palpitations, racing thoughts. Onset of symptoms was approximately  years ago, He denies current suicidal and homicidal ideation. Possible organic causes contributing are: none. Risk factors: negative life event: 2 divorces and previous episode of depression. Past treatment includes Prozac, Wellbutrin and individual therapy.  He complains of the following side effects from the treatment: felt no emotion, stuttering was worse. Reports last treated 14 years ago, states he was self-medicating with drinks 4-6 beers nightly. Has trouble with emotional connections, has a current fiance. States he is self-employed. Pt reports he is still doing well on Effexor '75mg'$  qd. Hypertension: Patient is currently maintained on the following medications for blood pressure: HCTZ Failed meds include: none Patient reports good compliance with blood pressure medications. Patient denies chest pain, headaches, shortness of breath or swelling.  Assessment & Plan:   Problem List Items Addressed This Visit   None   Subjective:    Outpatient Medications Prior to Visit  Medication Sig Dispense Refill  . hydrOXYzine (ATARAX) 10 MG tablet TAKE 1 TO 2 TABLETS BY MOUTH IN THE EVENINGS IF NEEDED TO CALM YOUR ANXIETY 30 tablet 5  . olmesartan-hydrochlorothiazide (BENICAR HCT) 20-12.5 MG tablet Take 1 tablet by mouth in the morning. 90 tablet 1  . venlafaxine XR (EFFEXOR XR) 75 MG 24 hr capsule Take 1 capsule (75 mg total) by mouth daily with breakfast. 90 capsule 1   No facility-administered medications prior to visit.   Past Medical History:  Diagnosis Date  . Anal fistula   . Anxiety   . Arthritis    "hands maybe" (11/26/2014)  . Depression   .  GERD (gastroesophageal reflux disease)   . Heavy smoker    1ppd  . Sepsis ([place]) 11/26/2014  . Stutter    Past Surgical History:  Procedure Laterality Date  . ANAL FISTULOTOMY N/A 08/03/2017   Procedure: EXAM UNDER ANESTHESIA WITH ANAL FISTULOTOMY;  Surgeon: Coralie Keens, MD;  Location: [facility];  Service: General;  Laterality: N/A;  . INCISION AND DRAINAGE ABSCESS Left 11/26/2014   buttocks  . IRRIGATION AND DEBRIDEMENT BUTTOCKS Left 11/26/2014   Procedure: Incision and Drainage of Left Buttock Abscess;  Surgeon: Donnie Mesa, MD;  Location: [facility];  Service: General;  Laterality: Left;  . KNEE ARTHROSCOPY Right 1988/1989   No Known Allergies    Objective:    Physical Exam Vitals and nursing note reviewed.  Constitutional:      General: He is not in acute distress.    Appearance: Normal appearance. He is obese.  HENT:     Head: Normocephalic.  Cardiovascular:     Rate and Rhythm: Normal rate and regular rhythm.  Pulmonary:     Effort: Pulmonary effort is normal.     Breath sounds: Normal breath sounds.  Musculoskeletal:        General: Normal range of motion.     Cervical back: Normal range of motion.  Skin:    General: Skin is warm and dry.  Neurological:     Mental Status: He is alert and oriented to person, place, and time.  Psychiatric:        Mood and Affect: Mood normal.  There were no vitals taken for this visit. Wt Readings from Last  3 Encounters:  02/02/22 296 lb 6.4 oz (134.4 kg)  12/31/21 295 lb 12.8 oz (134.2 kg)  12/09/21 285 lb 12.8 oz (129.6 kg)       Jeanie Sewer, NP

## 2022-05-03 ENCOUNTER — Ambulatory Visit: Payer: Commercial Managed Care - HMO | Admitting: Family

## 2022-05-03 DIAGNOSIS — I10 Essential (primary) hypertension: Secondary | ICD-10-CM

## 2022-07-06 NOTE — Progress Notes (Unsigned)
   Patient ID: Jon Wade, male    DOB: [birth year], 52 y.o.   MRN: [id number]  No chief complaint on file.   HPI:           Assessment & Plan:     Subjective:    Outpatient Medications Prior to Visit  Medication Sig Dispense Refill  . hydrOXYzine (ATARAX) 10 MG tablet TAKE 1 TO 2 TABLETS BY MOUTH IN THE EVENINGS IF NEEDED TO CALM YOUR ANXIETY 30 tablet 5  . olmesartan-hydrochlorothiazide (BENICAR HCT) 20-12.5 MG tablet Take 1 tablet by mouth in the morning. 90 tablet 1  . venlafaxine XR (EFFEXOR XR) 75 MG 24 hr capsule Take 1 capsule (75 mg total) by mouth daily with breakfast. 90 capsule 1   No facility-administered medications prior to visit.   Past Medical History:  Diagnosis Date  . Anal fistula   . Anxiety   . Arthritis    "hands maybe" (11/26/2014)  . Depression   . GERD (gastroesophageal reflux disease)   . Heavy smoker    1ppd  . Sepsis ([place]) 11/26/2014  . Stutter    Past Surgical History:  Procedure Laterality Date  . ANAL FISTULOTOMY N/A 08/03/2017   Procedure: EXAM UNDER ANESTHESIA WITH ANAL FISTULOTOMY;  Surgeon: Coralie Keens, MD;  Location: [facility];  Service: General;  Laterality: N/A;  . INCISION AND DRAINAGE ABSCESS Left 11/26/2014   buttocks  . IRRIGATION AND DEBRIDEMENT BUTTOCKS Left 11/26/2014   Procedure: Incision and Drainage of Left Buttock Abscess;  Surgeon: Donnie Mesa, MD;  Location: [facility];  Service: General;  Laterality: Left;  . KNEE ARTHROSCOPY Right 1988/1989   No Known Allergies    Objective:    Physical Exam Vitals and nursing note reviewed.  Constitutional:      General: He is not in acute distress.    Appearance: Normal appearance.  HENT:     Head: Normocephalic.  Cardiovascular:     Rate and Rhythm: Normal rate and regular rhythm.  Pulmonary:     Effort: Pulmonary effort is normal.     Breath sounds: Normal breath sounds.  Musculoskeletal:        General: Normal range of motion.     Cervical  back: Normal range of motion.  Skin:    General: Skin is warm and dry.  Neurological:     Mental Status: He is alert and oriented to person, place, and time.  Psychiatric:        Mood and Affect: Mood normal.  There were no vitals taken for this visit. Wt Readings from Last 3 Encounters:  02/02/22 296 lb 6.4 oz (134.4 kg)  12/31/21 295 lb 12.8 oz (134.2 kg)  12/09/21 285 lb 12.8 oz (129.6 kg)       Jeanie Sewer, NP

## 2022-07-07 ENCOUNTER — Ambulatory Visit (INDEPENDENT_AMBULATORY_CARE_PROVIDER_SITE_OTHER): Payer: Commercial Managed Care - HMO | Admitting: Family

## 2022-07-07 VITALS — BP 112/74 | HR 72 | Temp 98.2°F | Ht 69.0 in | Wt 260.5 lb

## 2022-07-07 DIAGNOSIS — M109 Gout, unspecified: Secondary | ICD-10-CM

## 2022-07-07 DIAGNOSIS — F1010 Alcohol abuse, uncomplicated: Secondary | ICD-10-CM

## 2022-07-07 DIAGNOSIS — I10 Essential (primary) hypertension: Secondary | ICD-10-CM | POA: Diagnosis not present

## 2022-07-07 LAB — COMPREHENSIVE METABOLIC PANEL
ALT: 24 U/L (ref 0–53)
AST: 17 U/L (ref 0–37)
Albumin: 4.7 g/dL (ref 3.5–5.2)
Alkaline Phosphatase: 47 U/L (ref 39–117)
BUN: 17 mg/dL (ref 6–23)
CO2: 28 mEq/L (ref 19–32)
Calcium: 9.9 mg/dL (ref 8.4–10.5)
Chloride: 102 mEq/L (ref 96–112)
Creatinine, Ser: 0.78 mg/dL (ref 0.40–1.50)
GFR: 102.39 mL/min (ref >60.00)
Glucose, Bld: 97 mg/dL (ref 70–99)
Potassium: 4.4 mEq/L (ref 3.5–5.1)
Sodium: 139 mEq/L (ref 135–145)
Total Bilirubin: 0.5 mg/dL (ref 0.2–1.2)
Total Protein: 7.5 g/dL (ref 6.0–8.3)

## 2022-07-07 LAB — LIPID PANEL
Cholesterol: 200 mg/dL (ref 0–200)
HDL: 38.7 mg/dL — ABNORMAL LOW (ref >39.00)
LDL Cholesterol: 141 mg/dL — ABNORMAL HIGH (ref 0–99)
NonHDL: 160.95
Total CHOL/HDL Ratio: 5
Triglycerides: 101 mg/dL (ref 0.0–149.0)
VLDL: 20.2 mg/dL (ref 0.0–40.0)

## 2022-07-07 LAB — URIC ACID: Uric Acid, Serum: 6.3 mg/dL (ref 4.0–7.8)

## 2022-07-07 MED ORDER — COLCHICINE 0.6 MG PO CAPS: ORAL_CAPSULE | ORAL | 0 refills | Status: DC

## 2022-07-07 MED ORDER — OLMESARTAN MEDOXOMIL-HCTZ 20-12.5 MG PO TABS: 1.0000 | ORAL_TABLET | Freq: Every day | ORAL | 1 refills | Status: DC

## 2022-07-07 NOTE — Assessment & Plan Note (Addendum)
intermittent right foot flare today, started about 4 days ago, pt reports sx are a little better today stopped drinking alcohol in Dec but has been eating high protein diet advised to avoid red meat, but also can't eat too much of any meat checking uric acid today sent Colchicine, advised on use & SE f/u prn

## 2022-07-07 NOTE — Assessment & Plan Note (Signed)
Chronic  pt has been trying to lose weight and make lifestyle choices to reduce his blood pressure - today he is down 36lbs since last September! pt reporting some dizziness, BP perfect today advised ok to cut Olmesartan-HCT in half, sending '20mg'$ -12.'5mg'$  dose to pharmacy continue monitoring BP and let me know of any high readings >130/90 f/u in 6 months or sooner if needed

## 2022-07-07 NOTE — Patient Instructions (Addendum)
It was very nice to see you today!    I will review your lab results via MyChart in a few days. See the handout attached. Reduce your Olmesartan-HCTZ to 1/2 pill daily, and I have sent a lower dose to your pharmacy.  I also sent over Colchicine for you foot pain, remember to not take any Advil or Aleve (Naproxen) with this. Call back if your foot is not getting better! Congrats on your weight loss and no longer drinking!    PLEASE NOTE:  If you had any lab tests please let us know if you have not heard back within a few days. You may see your results on MyChart before we have a chance to review them but we will give you a call once they are reviewed by [place]. If we ordered any referrals today, please let us know if you have not heard from their office within the next week.
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
## 2022-07-09 ENCOUNTER — Telehealth: Payer: Self-pay | Admitting: Family

## 2022-07-09 NOTE — Telephone Encounter (Signed)
Pt want all RX to be sent to the [facility] in chart instead of the [facility], any Rx that was recently sent to [facility] needs to be resent to [facility]. Please advise.

## 2022-07-11 NOTE — Progress Notes (Signed)
Your glucose (sugar), electrolytes, kidney and liver function, and cholesterol numbers are all good.  Your triglycerides are much better and this is from cutting out alcohol, so good job! Your uric acid is normal, but I think the gout is improving, if we tested your level on day 1 or 2 of symptoms I think it would have been higher. I would not use the tiger balm anymore, I think this could aggravate the swelling and pain. And I meant to tell you to be careful with ALL meat, not just red meat! Red meat is the worst for causing gout, but too much chicken, lamb, etc can also bring on gout symptoms. Hopefully you are getting relief with the Colchicine.

## 2022-07-12 ENCOUNTER — Other Ambulatory Visit: Payer: Self-pay

## 2022-07-12 DIAGNOSIS — M109 Gout, unspecified: Secondary | ICD-10-CM

## 2022-07-12 DIAGNOSIS — I10 Essential (primary) hypertension: Secondary | ICD-10-CM

## 2022-07-12 DIAGNOSIS — F419 Anxiety disorder, unspecified: Secondary | ICD-10-CM

## 2022-07-12 MED ORDER — VENLAFAXINE HCL ER 75 MG PO CP24: 75.0000 mg | ORAL_CAPSULE | Freq: Every day | ORAL | 1 refills | Status: DC

## 2022-07-12 MED ORDER — OLMESARTAN MEDOXOMIL-HCTZ 20-12.5 MG PO TABS: 1.0000 | ORAL_TABLET | Freq: Every day | ORAL | 1 refills | Status: DC

## 2022-07-12 MED ORDER — HYDROXYZINE HCL 10 MG PO TABS: ORAL_TABLET | ORAL | 5 refills | Status: AC

## 2022-07-12 MED ORDER — COLCHICINE 0.6 MG PO CAPS: ORAL_CAPSULE | ORAL | 0 refills | Status: AC

## 2022-07-16 ENCOUNTER — Encounter: Payer: Self-pay | Admitting: Family

## 2022-09-09 ENCOUNTER — Telehealth: Payer: Self-pay | Admitting: Family

## 2022-09-09 NOTE — Telephone Encounter (Signed)
yes, if they will accept - did he forget to take meds with him to [facility]?

## 2022-09-09 NOTE — Telephone Encounter (Signed)
PT IS OUT OF STATE FOR WORK AND NEEDS REFILL ON THE BELOW MEDS TO BE SENT TO [facility]     Prescription Request  09/09/2022  LOV: 07/07/2022  What is the name of the medication or equipment?  olmesartan-hydrochlorothiazide (BENICAR HCT) 20-12.5 MG tablet   venlafaxine XR (EFFEXOR XR) 75 MG 24 hr capsule   Have you contacted your pharmacy to request a refill? No   Which pharmacy would you like this sent to?  [facility] [address] [place], [address]   Patient notified that their request is being sent to the clinical staff for review and that they should receive a response within 2 business days.   Please advise at Mobile [phone] (mobile)

## 2022-09-10 ENCOUNTER — Other Ambulatory Visit: Payer: Self-pay

## 2022-09-10 DIAGNOSIS — F32A Depression, unspecified: Secondary | ICD-10-CM

## 2022-09-27 ENCOUNTER — Other Ambulatory Visit: Payer: Self-pay

## 2022-09-27 DIAGNOSIS — F419 Anxiety disorder, unspecified: Secondary | ICD-10-CM

## 2022-09-27 DIAGNOSIS — I10 Essential (primary) hypertension: Secondary | ICD-10-CM

## 2022-09-27 MED ORDER — VENLAFAXINE HCL ER 75 MG PO CP24: 75.0000 mg | ORAL_CAPSULE | Freq: Every day | ORAL | 1 refills | Status: AC

## 2022-09-27 MED ORDER — OLMESARTAN MEDOXOMIL-HCTZ 20-12.5 MG PO TABS: 1.0000 | ORAL_TABLET | Freq: Every day | ORAL | 1 refills | Status: DC

## 2022-09-27 NOTE — Telephone Encounter (Signed)
I called pt and Pt states he did not pick up medications In February due to not having insurance. He has been in [place] since last month. Rx has been sent.

## 2022-11-17 ENCOUNTER — Encounter: Payer: Self-pay | Admitting: Family

## 2023-01-02 NOTE — Progress Notes (Unsigned)
Patient ID: Jon Wade, male    DOB: 1970/11/05, 52 y.o.   MRN: [id number]  HPI: Anxiety/Depression: Patient complains of anxiety disorder and depression.   He has the following symptoms: difficulty concentrating, fatigue, irritable, palpitations, racing thoughts. Onset of symptoms was approximately  years ago, He denies current suicidal and homicidal ideation. Possible organic causes contributing are: none. Risk factors: negative life event: 2 divorces and previous episode of depression. Past treatment includes Prozac, Wellbutrin and individual therapy.  He complains of the following side effects from the treatment: felt no emotion, stuttering was worse. Reports last treated 14 years ago, states he was self-medicating with drinks 4-6 beers nightly. Has trouble with emotional connections, has a current fiance. States he is self-employed. Pt reports he is still doing well on Effexor 75mg  qd. Hypertension: Patient is currently maintained on the following medications for blood pressure: HCTZ Failed meds include: none Patient reports good compliance with blood pressure medications. Patient denies chest pain, headaches, shortness of breath or swelling.    Assessment & Plan:  There are no diagnoses linked to this encounter.  Subjective:    Outpatient Medications Prior to Visit  Medication Sig Dispense Refill   Colchicine 0.6 MG CAPS Day 1: Take 2 caps, then 1 cap an hour later. Day 2 and beyond: 1 cap daily 30 capsule 0   hydrOXYzine (ATARAX) 10 MG tablet TAKE 1 TO 2 TABLETS BY MOUTH IN THE EVENINGS IF NEEDED TO CALM YOUR ANXIETY 30 tablet 5   olmesartan-hydrochlorothiazide (BENICAR HCT) 20-12.5 MG tablet Take 1 tablet by mouth daily. 90 tablet 1   venlafaxine XR (EFFEXOR XR) 75 MG 24 hr capsule Take 1 capsule (75 mg total) by mouth daily with breakfast. 90 capsule 1   No facility-administered medications prior to visit.   Past Medical History:  Diagnosis Date   Anal fistula    Anxiety     Arthritis    "hands maybe" (11/26/2014)   Depression    GERD (gastroesophageal reflux disease)    Heavy smoker    1ppd   Sepsis (HCC) 11/26/2014   Stutter    Past Surgical History:  Procedure Laterality Date   ANAL FISTULOTOMY N/A 08/03/2017   Procedure: EXAM UNDER ANESTHESIA WITH ANAL FISTULOTOMY;  Surgeon: Abigail Miyamoto, MD;  Location: [facility];  Service: General;  Laterality: N/A;   INCISION AND DRAINAGE ABSCESS Left 11/26/2014   buttocks   IRRIGATION AND DEBRIDEMENT BUTTOCKS Left 11/26/2014   Procedure: Incision and Drainage of Left Buttock Abscess;  Surgeon: Manus Rudd, MD;  Location: [facility];  Service: General;  Laterality: Left;   KNEE ARTHROSCOPY Right 1988/1989   No Known Allergies    Objective:    Physical Exam Vitals and nursing note reviewed.  Constitutional:      General: He is not in acute distress.    Appearance: Normal appearance.  HENT:     Head: Normocephalic.  Cardiovascular:     Rate and Rhythm: Normal rate and regular rhythm.  Pulmonary:     Effort: Pulmonary effort is normal.     Breath sounds: Normal breath sounds.  Musculoskeletal:        General: Normal range of motion.     Cervical back: Normal range of motion.  Skin:    General: Skin is warm and dry.  Neurological:     Mental Status: He is alert and oriented to person, place, and time.  Psychiatric:        Mood and Affect: Mood  normal.    There were no vitals taken for this visit. Wt Readings from Last 3 Encounters:  07/07/22 260 lb 8 oz (118.2 kg)  02/02/22 296 lb 6.4 oz (134.4 kg)  12/31/21 295 lb 12.8 oz (134.2 kg)       Dulce Sellar, NP

## 2023-01-03 ENCOUNTER — Ambulatory Visit: Payer: Commercial Managed Care - HMO | Admitting: Family

## 2023-01-03 DIAGNOSIS — I10 Essential (primary) hypertension: Secondary | ICD-10-CM

## 2023-01-03 DIAGNOSIS — F419 Anxiety disorder, unspecified: Secondary | ICD-10-CM

## 2023-01-25 IMAGING — MR MR ABDOMEN WO/W CM
15 series · 48 of 48 positions shown · IV contrast (gadavist)
Comparison: CT abdomen 04/24/2021, abdominal ultrasound 09/01/2021

CLINICAL DATA: Left hepatic lobe mass

EXAM:
MRI ABDOMEN WITHOUT AND WITH CONTRAST
TECHNIQUE: Multiplanar multisequence MR imaging of the abdomen was performed
both before and after the administration of intravenous contrast.
CONTRAST:  10mL GADAVIST GADOBUTROL 1 MMOL/ML IV SOLN

[Series 4: T2 fat-sat · 1 of 15 slices shown (1 of 2)]
[im 1/15]
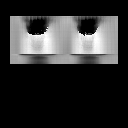

[Series 5: T2 fat-sat · axial · 6.0mm · 1.25mm/px · 1 of 36 slices shown (2 of 2)]
[im 1/36]
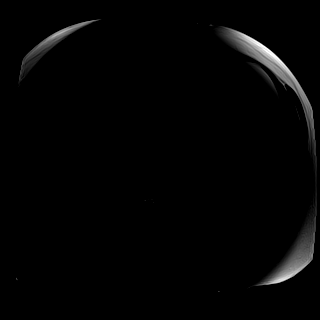

[Series 6: T1 · axial · 3.0mm · 1.25mm/px · z∈[-60,+177]mm · 3 of 80 slices shown (1 of 2)]
[im 1/80]
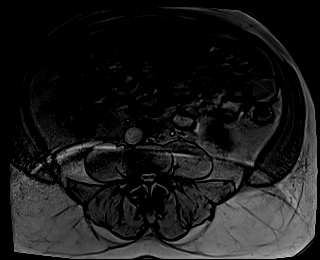
[im 40/80]
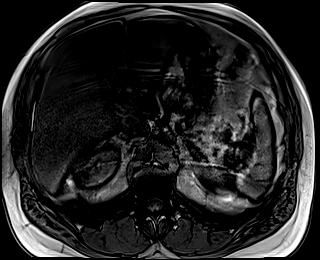
[im 80/80]
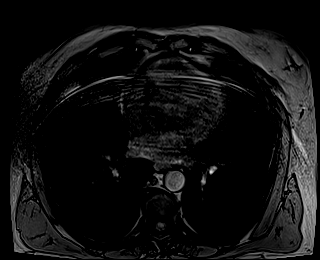

[Series 7: T1 · axial · 3.0mm · 1.25mm/px · z∈[-60,+177]mm · 3 of 80 slices shown (2 of 2)]
[im 1/80]
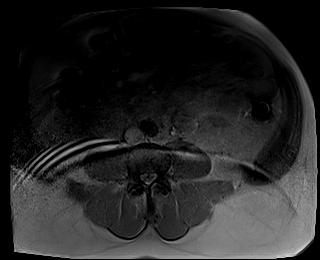
[im 40/80]
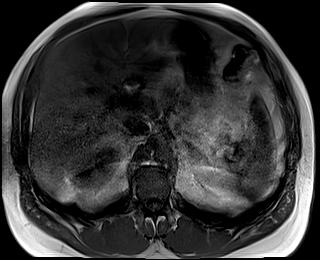
[im 80/80]
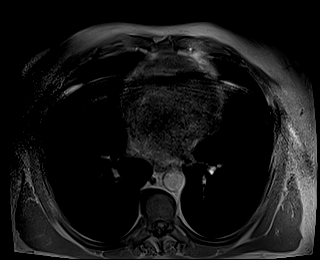

[Series 8: DWI · axial · 6.0mm · 1.49mm/px · z∈[-82,+199]mm · 3 of 80 slices shown (1 of 2)]
[im 1/80]
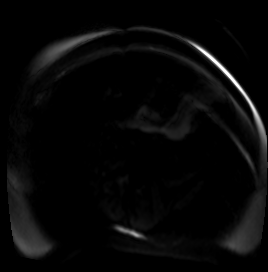
[im 40/80]
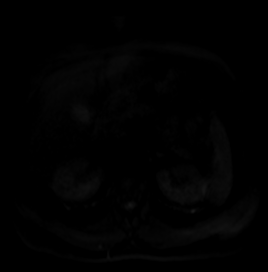
[im 80/80]
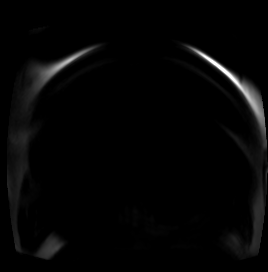

[Series 9: DWI · axial · 6.0mm · 1.49mm/px · z∈[-82,+199]mm · 2 of 40 slices shown (2 of 2)]
[im 1/40]
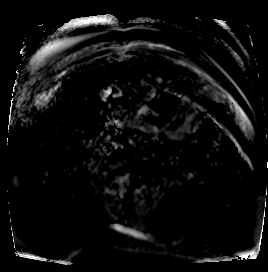
[im 40/40]
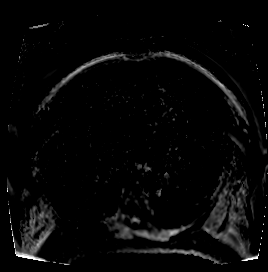

[Series 10: bSSFP · axial · 4.0mm · 0.84mm/px · z∈[-62,+178]mm · 3 of 61 slices shown]
[im 1/61]
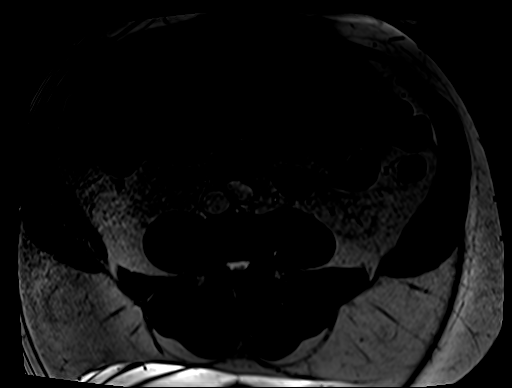
[im 31/61]
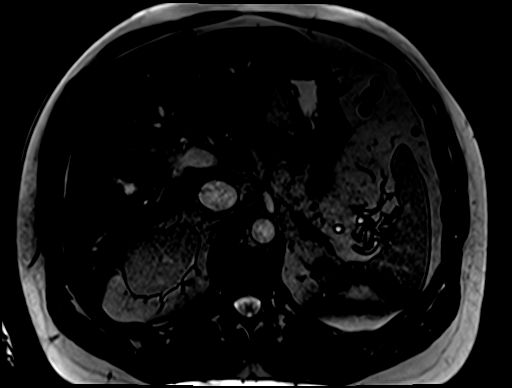
[im 61/61]
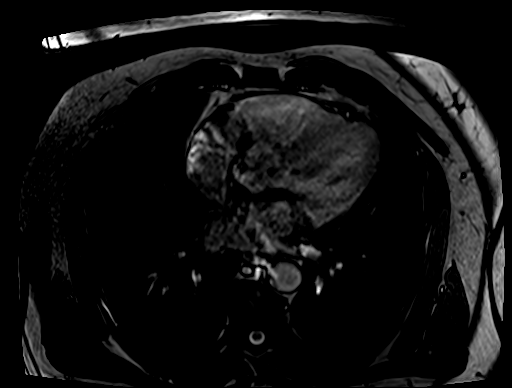

[Series 12: T1 dynamic · axial · 3.0mm · 1.25mm/px · z∈[-94,+167]mm · 4 of 88 slices shown (1 of 8)]
[im 1/88]
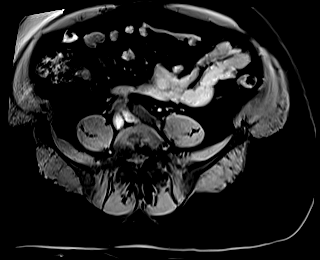
[im 30/88]
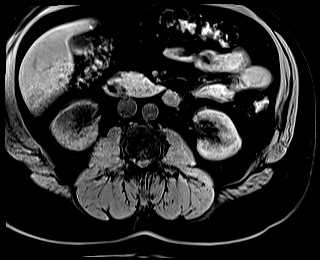
[im 59/88]
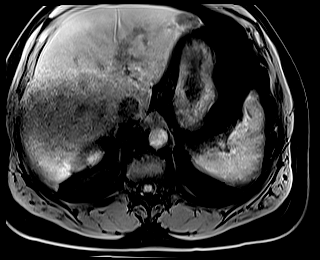
[im 88/88]
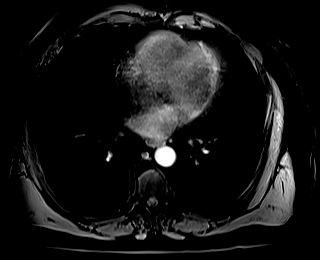

[Series 15: T1 dynamic · axial · 3.0mm · 1.25mm/px · z∈[-94,+167]mm · 4 of 88 slices shown (2 of 8)]
[im 1/88]
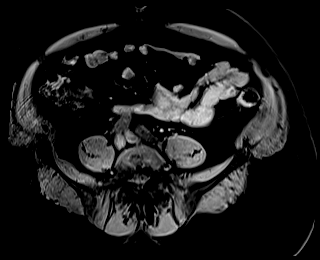
[im 30/88]
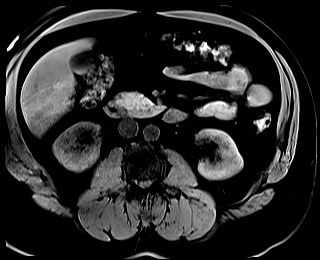
[im 59/88]
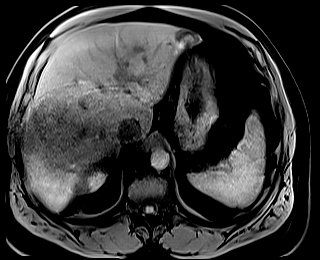
[im 88/88]
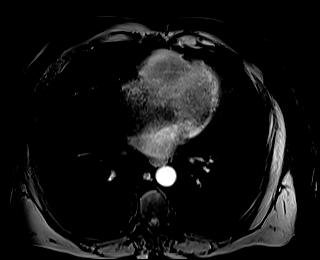

[Series 18: T1 dynamic · axial · 3.0mm · 1.25mm/px · z∈[-94,+167]mm · 4 of 88 slices shown (3 of 8)]
[im 1/88]
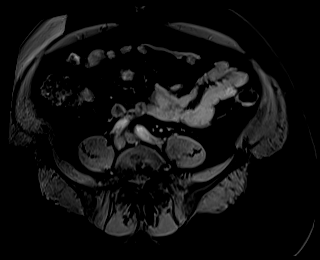
[im 30/88]
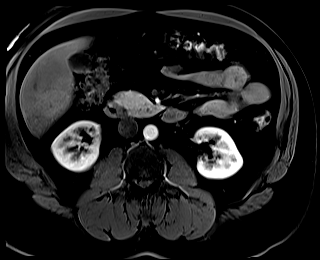
[im 59/88]
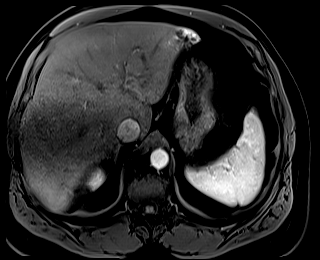
[im 88/88]
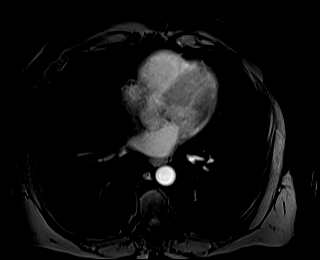

[Series 20: T1 dynamic · axial · 3.0mm · 1.25mm/px · z∈[-94,+167]mm · 4 of 88 slices shown (4 of 8)]
[im 1/88]
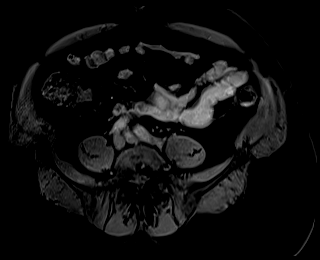
[im 30/88]
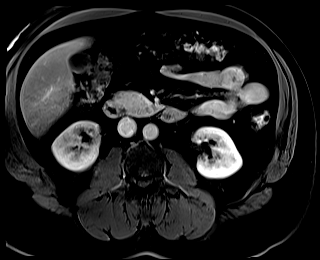
[im 59/88]
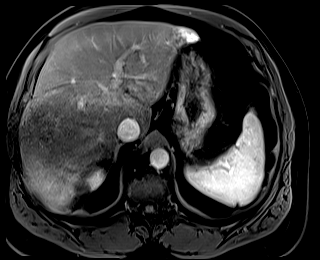
[im 88/88]
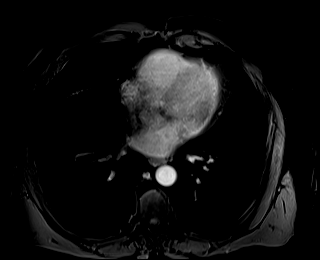

[Series 22: T1 dynamic · axial · 3.0mm · 1.25mm/px · z∈[-94,+167]mm · 4 of 88 slices shown (5 of 8)]
[im 1/88]
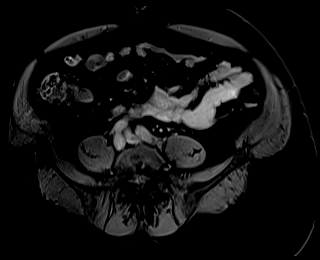
[im 30/88]
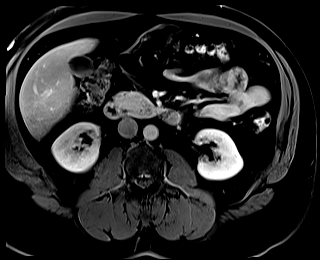
[im 59/88]
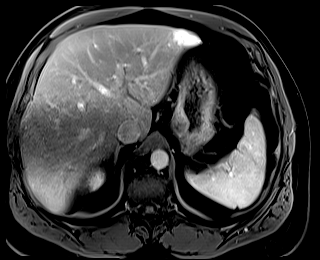
[im 88/88]
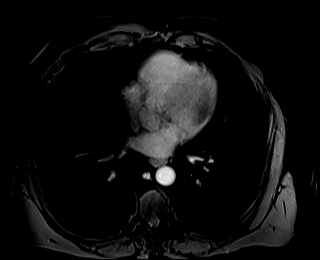

[Series 24: T1 dynamic · coronal · 3.0mm · 1.41mm/px · 4 of 88 slices shown (6 of 8)]
[im 1/88]
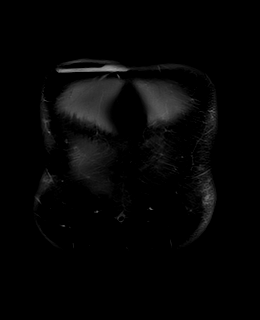
[im 30/88]
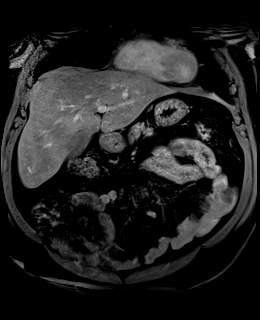
[im 59/88]
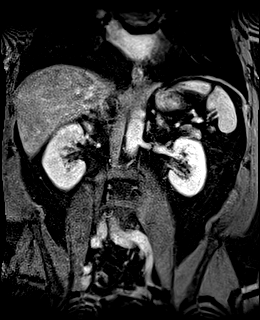
[im 88/88]
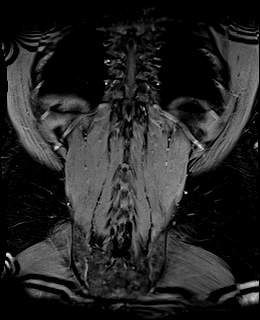

[Series 26: T1 dynamic · axial · 3.0mm · 1.25mm/px · z∈[-94,+167]mm · 4 of 88 slices shown (7 of 8)]
[im 1/88]
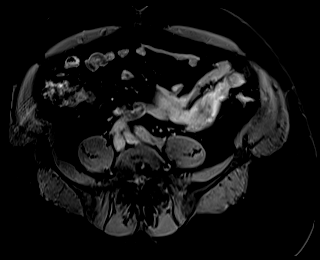
[im 30/88]
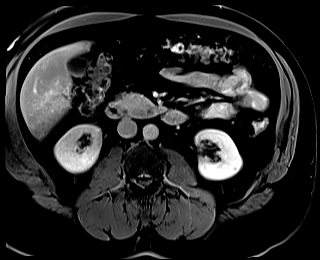
[im 59/88]
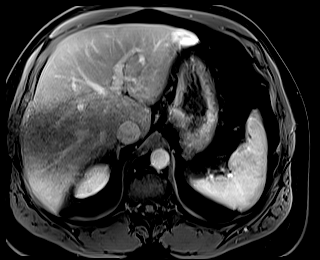
[im 88/88]
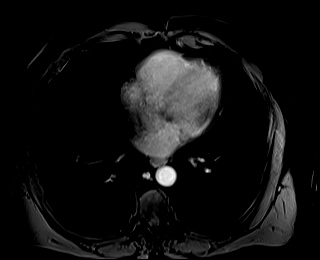

[Series 28: T1 dynamic · axial · 3.0mm · 1.25mm/px · z∈[-94,+167]mm · 4 of 88 slices shown (8 of 8)]
[im 1/88]
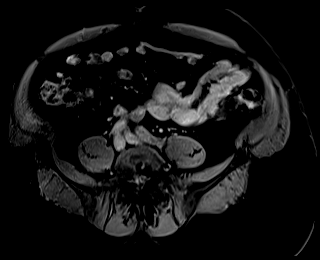
[im 30/88]
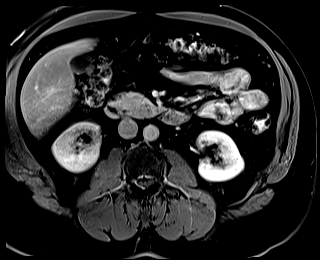
[im 59/88]
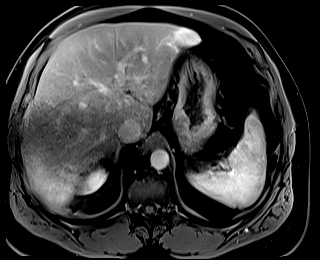
[im 88/88]
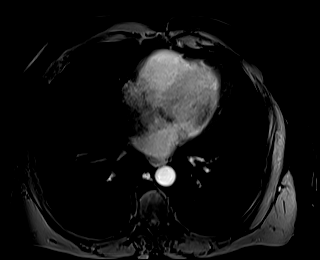

[48 of 48 positions shown; findings below may reference images not displayed]

FINDINGS: Lower chest: No acute findings.

Hepatobiliary: Liver is upper normal size with normal contour. There
is evidence of hepatic steatosis. 3.2 x 2.2 cm somewhat lobulated
hyperintense T2 signal mass identified in the left hepatic lobe with
enhancement pattern consistent with hemangioma. No additional
hepatic lesions identified. Gallbladder appears within normal
limits. No biliary ductal dilatation.

Pancreas: No mass, inflammatory changes, or other parenchymal
abnormality identified.

Spleen:  Within normal limits in size and appearance.

Adrenals/Urinary Tract: Adrenal glands appear normal. Kidneys appear
normal.

Stomach/Bowel: Colonic diverticulosis. No evidence of bowel
obstruction.

Vascular/Lymphatic: No pathologically enlarged lymph nodes
identified. No abdominal aortic aneurysm demonstrated.

Other:  No ascites.

Musculoskeletal: No suspicious bone lesions identified.
IMPRESSION: 1. Left hepatic lobe hemangioma.
2. Hepatic steatosis.
3. Colonic diverticulosis.

## 2023-03-03 ENCOUNTER — Telehealth: Payer: Self-pay | Admitting: Family

## 2023-03-03 NOTE — Telephone Encounter (Signed)
See my note below. Pt has no insurance, can't afford visit until insured sometime in November or December.

## 2023-03-03 NOTE — Telephone Encounter (Signed)
Prescription Request  03/03/2023  LOV: 07/07/2022  What is the name of the medication or equipment? venlafaxine XR (EFFEXOR XR) 75 MG 24 hr capsule   olmesartan-hydrochlorothiazide (BENICAR HCT) 20-12.5 MG tablet  I offered pt an OV but he stated he won't have any insurance until November/December. States he will make F/U when insured.  Have you contacted your pharmacy to request a refill? Yes   Which pharmacy would you like this sent to?  [facility] [address] [place] - [address] [place] [address] Phone: [phone] Fax: [phone]    Patient notified that their request is being sent to the clinical staff for review and that they should receive a response within 2 business days.   Please advise at Mobile [phone] (mobile)

## 2023-03-04 NOTE — Telephone Encounter (Signed)
yes, 30 day supply with 2 refills, remind him he can use the [employer] coupon card for discount - thx

## 2023-03-07 NOTE — Telephone Encounter (Signed)
Pt called back and needs meds asap

## 2023-03-08 MED ORDER — VENLAFAXINE HCL ER 75 MG PO CP24: 75.0000 mg | ORAL_CAPSULE | Freq: Every day | ORAL | 2 refills | Status: AC

## 2023-03-08 NOTE — Telephone Encounter (Signed)
Med sent.

## 2023-03-30 ENCOUNTER — Telehealth: Payer: Self-pay | Admitting: Family

## 2023-03-30 NOTE — Telephone Encounter (Signed)
yes, just 90 pills, no refills

## 2023-03-30 NOTE — Telephone Encounter (Signed)
Prescription Request  Patient declined OV due to having no insurance until end of November/early December  03/30/2023  LOV: 07/07/2022  What is the name of the medication or equipment? olmesartan-hydrochlorothiazide (BENICAR HCT) 20-12.5 MG tablet   Have you contacted your pharmacy to request a refill? Yes   Which pharmacy would you like this sent to?  [facility] [address], [place] - [address] [place] [address] Phone: [phone] Fax: [phone]  Patient notified that their request is being sent to the clinical staff for review and that they should receive a response within 2 business days.   Please advise at Mobile [phone] (mobile)

## 2023-03-31 ENCOUNTER — Other Ambulatory Visit: Payer: Self-pay

## 2023-03-31 DIAGNOSIS — I10 Essential (primary) hypertension: Secondary | ICD-10-CM

## 2023-03-31 MED ORDER — OLMESARTAN MEDOXOMIL-HCTZ 20-12.5 MG PO TABS: 1.0000 | ORAL_TABLET | Freq: Every day | ORAL | 0 refills | Status: AC

## 2023-03-31 NOTE — Telephone Encounter (Signed)
LVM for Patient to call back to schedule OV for early December

## 2023-04-04 NOTE — Telephone Encounter (Signed)
Patient is scheduled for OV on 05/18/23

## 2023-05-18 ENCOUNTER — Ambulatory Visit: Payer: Self-pay | Admitting: Family

## 2023-05-18 DIAGNOSIS — F419 Anxiety disorder, unspecified: Secondary | ICD-10-CM

## 2023-05-18 DIAGNOSIS — E782 Mixed hyperlipidemia: Secondary | ICD-10-CM | POA: Insufficient documentation

## 2023-05-18 DIAGNOSIS — I10 Essential (primary) hypertension: Secondary | ICD-10-CM

## 2023-05-18 NOTE — Progress Notes (Deleted)
   Patient ID: Jon Wade, male    DOB: 28-Jan-1971, 52 y.o.   MRN: [id number]  No chief complaint on file.        Assessment & Plan:   Subjective:    Outpatient Medications Prior to Visit  Medication Sig Dispense Refill   Colchicine 0.6 MG CAPS Day 1: Take 2 caps, then 1 cap an hour later. Day 2 and beyond: 1 cap daily 30 capsule 0   hydrOXYzine (ATARAX) 10 MG tablet TAKE 1 TO 2 TABLETS BY MOUTH IN THE EVENINGS IF NEEDED TO CALM YOUR ANXIETY 30 tablet 5   olmesartan-hydrochlorothiazide (BENICAR HCT) 20-12.5 MG tablet Take 1 tablet by mouth daily. 90 tablet 0   venlafaxine XR (EFFEXOR XR) 75 MG 24 hr capsule Take 1 capsule (75 mg total) by mouth daily with breakfast. 90 capsule 1   venlafaxine XR (EFFEXOR XR) 75 MG 24 hr capsule Take 1 capsule (75 mg total) by mouth daily with breakfast. 30 capsule 2   No facility-administered medications prior to visit.   Past Medical History:  Diagnosis Date   Anal fistula    Anxiety    Arthritis    "hands maybe" (11/26/2014)   Depression    GERD (gastroesophageal reflux disease)    Heavy smoker    1ppd   Sepsis (HCC) 11/26/2014   Stutter    Past Surgical History:  Procedure Laterality Date   ANAL FISTULOTOMY N/A 08/03/2017   Procedure: EXAM UNDER ANESTHESIA WITH ANAL FISTULOTOMY;  Surgeon: Abigail Miyamoto, MD;  Location:  S[facility]ERY [facility];  Service: General;  Laterality: N/A;   INCISION AND DRAINAGE ABSCESS Left 11/26/2014   buttocks   IRRIGATION AND DEBRIDEMENT BUTTOCKS Left 11/26/2014   Procedure: Incision and Drainage of Left Buttock Abscess;  Surgeon: Manus Rudd, MD;  Location: [facility];  Service: General;  Laterality: Left;   KNEE ARTHROSCOPY Right 1988/1989   No Known Allergies    Objective:    Physical Exam Vitals and nursing note reviewed.  Constitutional:      General: He is not in acute distress.    Appearance: Normal appearance.  HENT:     Head: Normocephalic.  Cardiovascular:     Rate and Rhythm: Normal  rate and regular rhythm.  Pulmonary:     Effort: Pulmonary effort is normal.     Breath sounds: Normal breath sounds.  Musculoskeletal:        General: Normal range of motion.     Cervical back: Normal range of motion.  Skin:    General: Skin is warm and dry.  Neurological:     Mental Status: He is alert and oriented to person, place, and time.  Psychiatric:        Mood and Affect: Mood normal.    There were no vitals taken for this visit. Wt Readings from Last 3 Encounters:  07/07/22 260 lb 8 oz (118.2 kg)  02/02/22 296 lb 6.4 oz (134.4 kg)  12/31/21 295 lb 12.8 oz (134.2 kg)       Dulce Sellar, NP

## 2023-07-28 ENCOUNTER — Other Ambulatory Visit: Payer: Self-pay | Admitting: Family
